# Patient Record
Sex: Male | Born: 1970
Health system: Southern US, Community
[De-identification: ages and names within clinical notes are randomized; demographics above are authoritative.]

## PROBLEM LIST (undated history)

## (undated) DIAGNOSIS — N529 Male erectile dysfunction, unspecified: Secondary | ICD-10-CM

## (undated) DIAGNOSIS — M1990 Unspecified osteoarthritis, unspecified site: Secondary | ICD-10-CM

## (undated) DIAGNOSIS — K219 Gastro-esophageal reflux disease without esophagitis: Secondary | ICD-10-CM

## (undated) HISTORY — DX: Male erectile dysfunction, unspecified: N52.9

## (undated) HISTORY — PX: OTHER SURGICAL HISTORY: SHX169

## (undated) HISTORY — DX: Unspecified osteoarthritis, unspecified site: M19.90

## (undated) HISTORY — DX: Gastro-esophageal reflux disease without esophagitis: K21.9

## (undated) HISTORY — PX: PELVIC FRACTURE SURGERY: SHX119

---

## 2014-12-24 ENCOUNTER — Ambulatory Visit: Payer: Self-pay | Admitting: Family Medicine

## 2015-01-24 ENCOUNTER — Ambulatory Visit (INDEPENDENT_AMBULATORY_CARE_PROVIDER_SITE_OTHER): Payer: Self-pay | Admitting: Family Medicine

## 2015-01-24 DIAGNOSIS — F99 Mental disorder, not otherwise specified: Secondary | ICD-10-CM

## 2015-01-24 NOTE — Progress Notes (Signed)
No show new patient visit 01/24/2015

## 2015-03-03 ENCOUNTER — Ambulatory Visit: Payer: Self-pay | Admitting: Family Medicine

## 2015-03-07 ENCOUNTER — Ambulatory Visit (INDEPENDENT_AMBULATORY_CARE_PROVIDER_SITE_OTHER): Payer: BLUE CROSS/BLUE SHIELD

## 2015-03-07 ENCOUNTER — Encounter: Payer: Self-pay | Admitting: Family Medicine

## 2015-03-07 ENCOUNTER — Telehealth: Payer: Self-pay | Admitting: Family Medicine

## 2015-03-07 ENCOUNTER — Other Ambulatory Visit: Payer: Self-pay | Admitting: Family Medicine

## 2015-03-07 ENCOUNTER — Ambulatory Visit (INDEPENDENT_AMBULATORY_CARE_PROVIDER_SITE_OTHER): Payer: BLUE CROSS/BLUE SHIELD | Admitting: Family Medicine

## 2015-03-07 VITALS — BP 119/71 | HR 56 | Ht 72.0 in | Wt 210.0 lb

## 2015-03-07 DIAGNOSIS — Z23 Encounter for immunization: Secondary | ICD-10-CM

## 2015-03-07 DIAGNOSIS — M545 Low back pain, unspecified: Secondary | ICD-10-CM

## 2015-03-07 DIAGNOSIS — Z8781 Personal history of (healed) traumatic fracture: Secondary | ICD-10-CM | POA: Insufficient documentation

## 2015-03-07 DIAGNOSIS — M5137 Other intervertebral disc degeneration, lumbosacral region: Secondary | ICD-10-CM | POA: Diagnosis not present

## 2015-03-07 DIAGNOSIS — N521 Erectile dysfunction due to diseases classified elsewhere: Secondary | ICD-10-CM

## 2015-03-07 DIAGNOSIS — M899 Disorder of bone, unspecified: Secondary | ICD-10-CM

## 2015-03-07 DIAGNOSIS — M533 Sacrococcygeal disorders, not elsewhere classified: Secondary | ICD-10-CM | POA: Diagnosis not present

## 2015-03-07 DIAGNOSIS — E663 Overweight: Secondary | ICD-10-CM | POA: Insufficient documentation

## 2015-03-07 DIAGNOSIS — N529 Male erectile dysfunction, unspecified: Secondary | ICD-10-CM | POA: Insufficient documentation

## 2015-03-07 LAB — CBC
HCT: 45.1 % (ref 39.0–52.0)
HEMOGLOBIN: 15.1 g/dL (ref 13.0–17.0)
MCH: 28.4 pg (ref 26.0–34.0)
MCHC: 33.5 g/dL (ref 30.0–36.0)
MCV: 84.9 fL (ref 78.0–100.0)
MPV: 11.2 fL (ref 8.6–12.4)
Platelets: 224 10*3/uL (ref 150–400)
RBC: 5.31 MIL/uL (ref 4.22–5.81)
RDW: 13.8 % (ref 11.5–15.5)
WBC: 5.9 10*3/uL (ref 4.0–10.5)

## 2015-03-07 LAB — LDL CHOLESTEROL, DIRECT: LDL DIRECT: 131 mg/dL — AB

## 2015-03-07 MED ORDER — OMEPRAZOLE 40 MG PO CPDR: 40.0000 mg | DELAYED_RELEASE_CAPSULE | Freq: Every day | ORAL | Status: AC

## 2015-03-07 NOTE — Assessment & Plan Note (Signed)
Possibly related to his pelvis fracture history. Obtain lumbar and pelvis films. Continued no more than 4 Goody powder per day. Start omeprazole for GI prophylaxis with high dose aspirin.

## 2015-03-07 NOTE — Progress Notes (Signed)
Blake Kelly is a 44 y.o. male who presents to [place]  today for establish care and discuss several medical issues.  1) neck pain. Patient has intermittent chronic low back pain. The pain typically does not radiate. No weakness or numbness bowel bladder dysfunction or difficulty walking. He rates the back pain to a history of an open book pelvis fracture several years ago. This was repaired surgically. He takes 2 baby powders typically in the morning and a max of 45. He powders during the day total as needed for pain. No radiating pain weakness or numbness. No new injury. He feels well otherwise.  2) erectile dysfunction: Patient notes occasional problems sustaining erections. This is happening after his pelvis fracture. He denies any numbness to his penis. He uses over-the-counter libido mass supplements which seemed to help well. He feels well and satisfied in his sexual life.  3) patient drinks soda excessively sometimes 2 L per day of sugars sweetened soda.  Patient works as a Product manager.    Past Medical History  Diagnosis Date  . Erectile dysfunction    Past Surgical History  Procedure Laterality Date  . Pelvic fracture surgery    .  pelvis fracture repair     History  Substance Use Topics  . Smoking status: Former Research scientist (life sciences)  . Smokeless tobacco: Not on file  . Alcohol Use: No   ROS as above Medications: Current Outpatient Prescriptions  Medication Sig Dispense Refill  . AMBULATORY NON FORMULARY MEDICATION tumeric    . AMBULATORY NON FORMULARY MEDICATION libidomax    . Aspirin-Salicylamide-Caffeine (BC HEADACHE POWDER PO) Take by mouth.    . Glucosamine-Chondroitin (MOVE FREE PO) Take by mouth.    Marland Kitchen Specialty Vitamins Products (MAGNESIUM, AMINO ACID CHELATE,) 133 MG tablet Take 1 tablet by mouth 2 (two) times daily.    Marland Kitchen omeprazole (PRILOSEC) 40 MG capsule Take 1 capsule (40 mg total) by mouth daily. 90 capsule 3   No current  facility-administered medications for this visit.   No Known Allergies   Exam:  BP 119/71 mmHg  Pulse 56  Ht 6' (1.829 m)  Wt 210 lb (95.255 kg)  BMI 28.47 kg/m2 Gen: Well NAD HEENT: EOMI,  MMM Lungs: Normal work of breathing. CTABL Heart: RRR no MRG Abd: NABS, Soft. Nondistended, Nontender Exts: Brisk capillary refill, warm and well perfused.   Back: Mildly tender bilateral SI joint. Normal back range of motion. Normal gait.  No results found for this or any previous visit (from the past 24 hour(s)). No results found.   Please see individual assessment and plan sections.

## 2015-03-07 NOTE — Patient Instructions (Addendum)
Thank you for coming in today. Continue the over-the-counter libido Max as desired. Take Goody powder as you have been no more than 4 per day. Take omeprazole daily to protect your stomach. Return in one year or sooner if needed Drinking soda so much. Come back or go to the emergency room if you notice new weakness new numbness problems walking or bowel or bladder problems.

## 2015-03-07 NOTE — Assessment & Plan Note (Signed)
Patient is satisfied with his sexual life with the over-the-counter medication. No change at this time. We'll start Cialis or Viagra as needed.

## 2015-03-07 NOTE — Assessment & Plan Note (Addendum)
Check CBC, CMP, direct LDL. Decrease sodium intake

## 2015-03-07 NOTE — Telephone Encounter (Signed)
Sclerotic lesion on xray. Bone scan ordered. CMA will call pt.

## 2015-03-11 ENCOUNTER — Encounter (HOSPITAL_COMMUNITY): Payer: BLUE CROSS/BLUE SHIELD

## 2015-03-20 ENCOUNTER — Encounter (HOSPITAL_COMMUNITY)
Admission: RE | Admit: 2015-03-20 | Discharge: 2015-03-20 | Disposition: A | Discharge status: Home / Self Care | Payer: BLUE CROSS/BLUE SHIELD | Source: Ambulatory Visit | Attending: Family Medicine | Admitting: Family Medicine

## 2015-03-20 DIAGNOSIS — M899 Disorder of bone, unspecified: Secondary | ICD-10-CM | POA: Diagnosis present

## 2015-03-20 MED ORDER — TECHNETIUM TC 99M MEDRONATE IV KIT
25.8000 | PACK | Freq: Once | INTRAVENOUS | Status: AC | PRN
  Administered 2015-03-20: 25.8 via INTRAVENOUS

## 2015-03-20 NOTE — Progress Notes (Signed)
Quick Note:  Normal scan. No cancer! ______

## 2016-11-08 ENCOUNTER — Ambulatory Visit (INDEPENDENT_AMBULATORY_CARE_PROVIDER_SITE_OTHER): Payer: BLUE CROSS/BLUE SHIELD | Admitting: Family Medicine

## 2016-11-08 ENCOUNTER — Ambulatory Visit (INDEPENDENT_AMBULATORY_CARE_PROVIDER_SITE_OTHER): Payer: BLUE CROSS/BLUE SHIELD

## 2016-11-08 VITALS — BP 132/81 | HR 51 | Ht 72.0 in | Wt 238.0 lb

## 2016-11-08 DIAGNOSIS — Z0000 Encounter for general adult medical examination without abnormal findings: Secondary | ICD-10-CM

## 2016-11-08 DIAGNOSIS — M79642 Pain in left hand: Secondary | ICD-10-CM

## 2016-11-08 DIAGNOSIS — R234 Changes in skin texture: Secondary | ICD-10-CM | POA: Diagnosis not present

## 2016-11-08 DIAGNOSIS — E663 Overweight: Secondary | ICD-10-CM

## 2016-11-08 DIAGNOSIS — M79641 Pain in right hand: Secondary | ICD-10-CM

## 2016-11-08 DIAGNOSIS — M899 Disorder of bone, unspecified: Secondary | ICD-10-CM

## 2016-11-08 DIAGNOSIS — M949 Disorder of cartilage, unspecified: Secondary | ICD-10-CM

## 2016-11-08 LAB — CBC
HEMATOCRIT: 44.7 % (ref 38.5–50.0)
HEMOGLOBIN: 15.3 g/dL (ref 13.2–17.1)
MCH: 29 pg (ref 27.0–33.0)
MCHC: 34.2 g/dL (ref 32.0–36.0)
MCV: 84.8 fL (ref 80.0–100.0)
MPV: 10.1 fL (ref 7.5–12.5)
Platelets: 232 10*3/uL (ref 140–400)
RBC: 5.27 MIL/uL (ref 4.20–5.80)
RDW: 13.5 % (ref 11.0–15.0)
WBC: 5.1 10*3/uL (ref 3.8–10.8)

## 2016-11-08 LAB — TSH: TSH: 1.63 m[IU]/L (ref 0.40–4.50)

## 2016-11-08 MED ORDER — FLUOCINONIDE 0.05 % EX OINT: 1.0000 "application " | TOPICAL_OINTMENT | Freq: Two times a day (BID) | CUTANEOUS | 0 refills | Status: DC

## 2016-11-08 MED ORDER — DICLOFENAC SODIUM 1 % TD GEL: 4.0000 g | Freq: Four times a day (QID) | TRANSDERMAL | 11 refills | Status: DC

## 2016-11-08 NOTE — Progress Notes (Signed)
Blake Kelly is a 46 y.o. male who presents to [place]: [place] today for physical and discuss hand pain and dry cracked skin.   Maxi is a fit and active 46 year old male. 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 He notes continued diffuse joint pain especially in his hands bilaterally. He notes a positive family history for psoriasis and worries that he may have a rheumatologic condition. He's takes EC patter 4 times daily and omeprazole daily.   Additionally he notes thickened dry skin on his hands and feet. This regularly becomes cracked. He occasionally uses moisturizers. He feels well otherwise. No fevers chills nausea vomiting or diarrhea. He does note that his hands swell up after he drinks dark sodas.   Past Medical History:  Diagnosis Date  . Erectile dysfunction    Past Surgical History:  Procedure Laterality Date  .  pelvis fracture repair    . PELVIC FRACTURE SURGERY     Social History  Substance Use Topics  . Smoking status: Former Research scientist (life sciences)  . Smokeless tobacco: Not on file  . Alcohol use No   family history is not on file.  ROS as above:  Medications: Current Outpatient Prescriptions  Medication Sig Dispense Refill  . AMBULATORY NON FORMULARY MEDICATION tumeric    . AMBULATORY NON FORMULARY MEDICATION libidomax    . Aspirin-Salicylamide-Caffeine (BC HEADACHE POWDER PO) Take by mouth.    . diclofenac sodium (VOLTAREN) 1 % GEL Apply 4 g topically 4 (four) times daily. To affected joint. 100 g 11  . fluocinonide ointment (LIDEX) 1.61 % Apply 1 application topically 2 (two) times daily. 30 g 0  . Glucosamine-Chondroitin (MOVE FREE PO) Take by mouth.    Marland Kitchen omeprazole (PRILOSEC) 40 MG capsule Take 1 capsule (40 mg total) by mouth daily. 90 capsule 3  . Specialty Vitamins Products (MAGNESIUM, AMINO ACID CHELATE,) 133 MG  tablet Take 1 tablet by mouth 2 (two) times daily.     No current facility-administered medications for this visit.    No Known Allergies  Health Maintenance Health Maintenance  Topic Date Due  . HIV Screening  03/14/1985  . INFLUENZA VACCINE  05/11/2017 (Originally 03/23/2016)  . TETANUS/TDAP  03/06/2025     Exam:  BP 132/81   Pulse (!) 51   Ht 6' (1.829 m)   Wt 238 lb (108 kg)   BMI 32.28 kg/m  Gen: Well NAD HEENT: EOMI,  MMM Lungs: Normal work of breathing. CTABL Heart: RRR no MRG Abd: NABS, Soft. Nondistended, Nontender Exts: Brisk capillary refill, warm and well perfused.  Skin: Thickened cracked skin hands and feet bilaterally.   No results found for this or any previous visit (from the past 72 hour(s)). Dg Hand Complete Left  Result Date: 11/08/2016 CLINICAL DATA:  Left hand pain.  No reported injury.  No recent. EXAM: LEFT HAND - COMPLETE 3+ VIEW COMPARISON:  None. FINDINGS: No acute bony or joint abnormality identified. No evidence of fracture or dislocation. IMPRESSION: No acute or focal abnormality identified. No evidence of fracture or dislocation. Electronically Signed   By: Marcello Moores  Register   On: 11/08/2016 09:35      Assessment and Plan: 46 y.o. male with well adult. Doing well. Check basic metabolic labs listed below.  Diffuse hand pain: We will complete a limited rheumatologic workup listed below a treating with diclofenac gel.  Thickened cracked skin: Unclear etiology. Recommended  moisturizer. Will treat with high potency steroid cream on hot spots.   Orders Placed This Encounter  Procedures  . DG Hand Complete Left    Standing Status:   Future    Number of Occurrences:   1    Standing Expiration Date:   01/08/2018    Order Specific Question:   Reason for Exam (SYMPTOM  OR DIAGNOSIS REQUIRED)    Answer:   eval left hand and wrist pain suspect arthritis    Order Specific Question:   Preferred imaging location?    Answer:   [facility]    . CBC  . COMPLETE METABOLIC PANEL WITH GFR  . Lipid Panel w/reflex Direct LDL  . TSH  . VITAMIN D 25 Hydroxy (Vit-D Deficiency, Fractures)  . Cyclic citrul peptide antibody, IgG  . Complement, total  . HLA-B27 antigen  . CK  . ANA  . Sedimentation rate   Meds ordered this encounter  Medications  . diclofenac sodium (VOLTAREN) 1 % GEL    Sig: Apply 4 g topically 4 (four) times daily. To affected joint.    Dispense:  100 g    Refill:  11  . fluocinonide ointment (LIDEX) 0.05 %    Sig: Apply 1 application topically 2 (two) times daily.    Dispense:  30 g    Refill:  0     Discussed warning signs or symptoms. Please see discharge instructions. Patient expresses understanding.

## 2016-11-08 NOTE — Patient Instructions (Signed)
Thank you for coming in today. Get labs today.  I will get results ASAP.  Get xray today.  Use voltaren gel for pain up to 4x daily.  Use the high potency steroid cream on the hot spots.  Use as much ointment (Vaseline) as you can on you hand and feet.   Lets recheck in a month or so.

## 2016-11-09 ENCOUNTER — Encounter: Payer: Self-pay | Admitting: Family Medicine

## 2016-11-09 DIAGNOSIS — R740 Nonspecific elevation of levels of transaminase and lactic acid dehydrogenase [LDH]: Secondary | ICD-10-CM

## 2016-11-09 DIAGNOSIS — R7401 Elevation of levels of liver transaminase levels: Secondary | ICD-10-CM | POA: Insufficient documentation

## 2016-11-09 DIAGNOSIS — R748 Abnormal levels of other serum enzymes: Secondary | ICD-10-CM | POA: Insufficient documentation

## 2016-11-09 LAB — COMPLETE METABOLIC PANEL WITH GFR
ALBUMIN: 4.2 g/dL (ref 3.6–5.1)
ALK PHOS: 69 U/L (ref 40–115)
ALT: 54 U/L — AB (ref 9–46)
AST: 43 U/L — AB (ref 10–40)
BUN: 19 mg/dL (ref 7–25)
CALCIUM: 9.3 mg/dL (ref 8.6–10.3)
CO2: 19 mmol/L — ABNORMAL LOW (ref 20–31)
CREATININE: 1.09 mg/dL (ref 0.60–1.35)
Chloride: 109 mmol/L (ref 98–110)
GFR, Est African American: >89 mL/min (ref >=60)
GFR, Est Non African American: 81 mL/min (ref >=60)
GLUCOSE: 103 mg/dL — AB (ref 65–99)
Potassium: 4.8 mmol/L (ref 3.5–5.3)
SODIUM: 143 mmol/L (ref 135–146)
Total Bilirubin: 0.2 mg/dL (ref 0.2–1.2)
Total Protein: 6.4 g/dL (ref 6.1–8.1)

## 2016-11-09 LAB — CK: Total CK: 370 U/L — ABNORMAL HIGH (ref 7–232)

## 2016-11-09 LAB — LIPID PANEL W/REFLEX DIRECT LDL
CHOLESTEROL: 211 mg/dL — AB (ref <200)
HDL: 50 mg/dL (ref >40)
LDL-Cholesterol: 146 mg/dL — ABNORMAL HIGH
NON-HDL CHOLESTEROL (CALC): 161 mg/dL — AB (ref <130)
Total CHOL/HDL Ratio: 4.2 Ratio (ref <5.0)
Triglycerides: 55 mg/dL (ref <150)

## 2016-11-09 LAB — ANA: Anti Nuclear Antibody(ANA): POSITIVE — AB

## 2016-11-09 LAB — CYCLIC CITRUL PEPTIDE ANTIBODY, IGG

## 2016-11-09 LAB — SEDIMENTATION RATE: Sed Rate: 4 mm/hr (ref 0–15)

## 2016-11-09 LAB — ANTI-NUCLEAR AB-TITER (ANA TITER): ANA Titer 1: 1:40 {titer} — ABNORMAL HIGH

## 2016-11-09 LAB — VITAMIN D 25 HYDROXY (VIT D DEFICIENCY, FRACTURES): VIT D 25 HYDROXY: 49 ng/mL (ref 30–100)

## 2016-11-10 LAB — COMPLEMENT, TOTAL: Compl, Total (CH50): 60 U/mL (ref 31–60)

## 2016-11-12 ENCOUNTER — Telehealth: Payer: Self-pay | Admitting: Family Medicine

## 2016-11-12 NOTE — Telephone Encounter (Signed)
Received PA on Diclofenac Sodium sent through cover my meds waiting on Authorization. - CF

## 2016-11-13 LAB — HLA-B27 ANTIGEN: DNA RESULT: NEGATIVE

## 2016-12-02 NOTE — Telephone Encounter (Signed)
I checked prior authorization on cover my meds and they denied medication. - CF

## 2016-12-06 ENCOUNTER — Ambulatory Visit (INDEPENDENT_AMBULATORY_CARE_PROVIDER_SITE_OTHER): Payer: BLUE CROSS/BLUE SHIELD | Admitting: Family Medicine

## 2016-12-06 ENCOUNTER — Encounter: Payer: Self-pay | Admitting: Family Medicine

## 2016-12-06 VITALS — BP 134/77 | HR 59 | Wt 240.0 lb

## 2016-12-06 DIAGNOSIS — R748 Abnormal levels of other serum enzymes: Secondary | ICD-10-CM

## 2016-12-06 DIAGNOSIS — R234 Changes in skin texture: Secondary | ICD-10-CM | POA: Diagnosis not present

## 2016-12-06 DIAGNOSIS — M79641 Pain in right hand: Secondary | ICD-10-CM | POA: Diagnosis not present

## 2016-12-06 DIAGNOSIS — M79642 Pain in left hand: Secondary | ICD-10-CM

## 2016-12-06 DIAGNOSIS — R740 Nonspecific elevation of levels of transaminase and lactic acid dehydrogenase [LDH]: Secondary | ICD-10-CM

## 2016-12-06 DIAGNOSIS — R7401 Elevation of levels of liver transaminase levels: Secondary | ICD-10-CM

## 2016-12-06 LAB — COMPREHENSIVE METABOLIC PANEL
ALT: 38 U/L (ref 9–46)
AST: 26 U/L (ref 10–40)
Albumin: 4 g/dL (ref 3.6–5.1)
Alkaline Phosphatase: 66 U/L (ref 40–115)
BUN: 16 mg/dL (ref 7–25)
CHLORIDE: 107 mmol/L (ref 98–110)
CO2: 27 mmol/L (ref 20–31)
CREATININE: 0.91 mg/dL (ref 0.60–1.35)
Calcium: 9.3 mg/dL (ref 8.6–10.3)
GLUCOSE: 98 mg/dL (ref 65–99)
Potassium: 4.3 mmol/L (ref 3.5–5.3)
SODIUM: 142 mmol/L (ref 135–146)
TOTAL PROTEIN: 6.4 g/dL (ref 6.1–8.1)
Total Bilirubin: 0.4 mg/dL (ref 0.2–1.2)

## 2016-12-06 NOTE — Progress Notes (Signed)
Blake Kelly is a 46 y.o. male who presents to [place]: [place] today for follow-up on rash and hand pain. Patient was seen previously worries found to have dry cracked skin as well as diffuse body pain. As part of a workup he had a rheumatologic panel obtained which showed a mildly positive ANA with a 1:40 titer speckled pattern. Additionally had mildly elevated CK. Sedimentation rate was normal. He notes bilateral wrist and hip pain. He notes that he's been using steroid cream for his hands which helps him along with moisturizers.   Past Medical History:  Diagnosis Date  . Erectile dysfunction    Past Surgical History:  Procedure Laterality Date  .  pelvis fracture repair    . PELVIC FRACTURE SURGERY     Social History  Substance Use Topics  . Smoking status: Former Research scientist (life sciences)  . Smokeless tobacco: Never Used  . Alcohol use No   family history is not on file.  ROS as above:  Medications: Current Outpatient Prescriptions  Medication Sig Dispense Refill  . AMBULATORY NON FORMULARY MEDICATION tumeric    . AMBULATORY NON FORMULARY MEDICATION libidomax    . Aspirin-Salicylamide-Caffeine (BC HEADACHE POWDER PO) Take by mouth.    . diclofenac sodium (VOLTAREN) 1 % GEL Apply 4 g topically 4 (four) times daily. To affected joint. 100 g 11  . fluocinonide ointment (LIDEX) 3.84 % Apply 1 application topically 2 (two) times daily. 30 g 0  . Glucosamine-Chondroitin (MOVE FREE PO) Take by mouth.    Marland Kitchen omeprazole (PRILOSEC) 40 MG capsule Take 1 capsule (40 mg total) by mouth daily. 90 capsule 3  . Specialty Vitamins Products (MAGNESIUM, AMINO ACID CHELATE,) 133 MG tablet Take 1 tablet by mouth 2 (two) times daily.     No current facility-administered medications for this visit.    No Known Allergies  Health Maintenance Health Maintenance  Topic Date Due  . HIV Screening   03/14/1985  . INFLUENZA VACCINE  05/11/2017 (Originally 03/23/2017)  . TETANUS/TDAP  03/06/2025     Exam:  BP 134/77   Pulse (!) 59   Wt 240 lb (108.9 kg)   BMI 32.55 kg/m  Gen: Well NAD HEENT: EOMI,  MMM Lungs: Normal work of breathing. CTABL Heart: RRR no MRG Abd: NABS, Soft. Nondistended, Nontender Exts: Brisk capillary refill, warm and well perfused. Synovitis present hands bilaterally. Skin: Dry cracked skin palmar hands bilaterally.  Lab Results  Component Value Date   ANA POS (A) 11/08/2016   Lab Results  Component Value Date   CKTOTAL 370 (H) 11/08/2016      Chemistry      Component Value Date/Time   NA 143 11/08/2016 0934   K 4.8 11/08/2016 0934   CL 109 11/08/2016 0934   CO2 19 (L) 11/08/2016 0934   BUN 19 11/08/2016 0934   CREATININE 1.09 11/08/2016 0934      Component Value Date/Time   CALCIUM 9.3 11/08/2016 0934   ALKPHOS 69 11/08/2016 0934   AST 43 (H) 11/08/2016 0934   ALT 54 (H) 11/08/2016 0934   BILITOT 0.2 11/08/2016 0934       No results found for this or any previous visit (from the past 72 hour(s)). No results found.    Assessment and Plan: 46 y.o. male with  Pain with elevated ANA and CK. Plan to recheck labs along with more specific rheumatologic labs including lupus workup listed below..  As for cracked skin  recommend continued moisturizers.  We'll recheck CMP based on mildly elevated transaminitis last time.  Orders Placed This Encounter  Procedures  . CK  . Comprehensive metabolic panel  . Anti-DNA antibody, double-stranded  . Anti-Smith antibody  . Sjogrens syndrome-A extractable nuclear antibody   No orders of the defined types were placed in this encounter.    Discussed warning signs or symptoms. Please see discharge instructions. Patient expresses understanding.

## 2016-12-06 NOTE — Patient Instructions (Signed)
Thank you for coming in today. Get labs today.  I will contact you with results.  Keep those hand moisturized.

## 2016-12-07 LAB — ANTI-SMITH ANTIBODY: ENA SM Ab Ser-aCnc: <1

## 2016-12-07 LAB — CK: CK TOTAL: 259 U/L — AB (ref 7–232)

## 2016-12-07 LAB — SJOGRENS SYNDROME-A EXTRACTABLE NUCLEAR ANTIBODY: SSA (Ro) (ENA) Antibody, IgG: <1

## 2016-12-07 LAB — ANTI-DNA ANTIBODY, DOUBLE-STRANDED: ds DNA Ab: <1 IU/mL

## 2017-05-19 DIAGNOSIS — M9905 Segmental and somatic dysfunction of pelvic region: Secondary | ICD-10-CM | POA: Diagnosis not present

## 2017-05-19 DIAGNOSIS — M5137 Other intervertebral disc degeneration, lumbosacral region: Secondary | ICD-10-CM | POA: Diagnosis not present

## 2017-05-19 DIAGNOSIS — M9904 Segmental and somatic dysfunction of sacral region: Secondary | ICD-10-CM | POA: Diagnosis not present

## 2017-05-19 DIAGNOSIS — M9903 Segmental and somatic dysfunction of lumbar region: Secondary | ICD-10-CM | POA: Diagnosis not present

## 2017-06-30 DIAGNOSIS — M9903 Segmental and somatic dysfunction of lumbar region: Secondary | ICD-10-CM | POA: Diagnosis not present

## 2017-06-30 DIAGNOSIS — M9905 Segmental and somatic dysfunction of pelvic region: Secondary | ICD-10-CM | POA: Diagnosis not present

## 2017-06-30 DIAGNOSIS — M5137 Other intervertebral disc degeneration, lumbosacral region: Secondary | ICD-10-CM | POA: Diagnosis not present

## 2017-06-30 DIAGNOSIS — M9904 Segmental and somatic dysfunction of sacral region: Secondary | ICD-10-CM | POA: Diagnosis not present

## 2017-07-28 DIAGNOSIS — M9903 Segmental and somatic dysfunction of lumbar region: Secondary | ICD-10-CM | POA: Diagnosis not present

## 2017-07-28 DIAGNOSIS — M9904 Segmental and somatic dysfunction of sacral region: Secondary | ICD-10-CM | POA: Diagnosis not present

## 2017-07-28 DIAGNOSIS — M5137 Other intervertebral disc degeneration, lumbosacral region: Secondary | ICD-10-CM | POA: Diagnosis not present

## 2017-07-28 DIAGNOSIS — M9905 Segmental and somatic dysfunction of pelvic region: Secondary | ICD-10-CM | POA: Diagnosis not present

## 2017-08-11 DIAGNOSIS — M9904 Segmental and somatic dysfunction of sacral region: Secondary | ICD-10-CM | POA: Diagnosis not present

## 2017-08-11 DIAGNOSIS — M5137 Other intervertebral disc degeneration, lumbosacral region: Secondary | ICD-10-CM | POA: Diagnosis not present

## 2017-08-11 DIAGNOSIS — M9903 Segmental and somatic dysfunction of lumbar region: Secondary | ICD-10-CM | POA: Diagnosis not present

## 2017-08-11 DIAGNOSIS — M9905 Segmental and somatic dysfunction of pelvic region: Secondary | ICD-10-CM | POA: Diagnosis not present

## 2017-08-29 DIAGNOSIS — M9905 Segmental and somatic dysfunction of pelvic region: Secondary | ICD-10-CM | POA: Diagnosis not present

## 2017-08-29 DIAGNOSIS — M5137 Other intervertebral disc degeneration, lumbosacral region: Secondary | ICD-10-CM | POA: Diagnosis not present

## 2017-08-29 DIAGNOSIS — M9903 Segmental and somatic dysfunction of lumbar region: Secondary | ICD-10-CM | POA: Diagnosis not present

## 2017-08-29 DIAGNOSIS — M9904 Segmental and somatic dysfunction of sacral region: Secondary | ICD-10-CM | POA: Diagnosis not present

## 2017-09-29 DIAGNOSIS — M9905 Segmental and somatic dysfunction of pelvic region: Secondary | ICD-10-CM | POA: Diagnosis not present

## 2017-09-29 DIAGNOSIS — M5137 Other intervertebral disc degeneration, lumbosacral region: Secondary | ICD-10-CM | POA: Diagnosis not present

## 2017-09-29 DIAGNOSIS — M9904 Segmental and somatic dysfunction of sacral region: Secondary | ICD-10-CM | POA: Diagnosis not present

## 2017-09-29 DIAGNOSIS — M9903 Segmental and somatic dysfunction of lumbar region: Secondary | ICD-10-CM | POA: Diagnosis not present

## 2017-10-06 DIAGNOSIS — M9903 Segmental and somatic dysfunction of lumbar region: Secondary | ICD-10-CM | POA: Diagnosis not present

## 2017-10-06 DIAGNOSIS — M9904 Segmental and somatic dysfunction of sacral region: Secondary | ICD-10-CM | POA: Diagnosis not present

## 2017-10-06 DIAGNOSIS — M9905 Segmental and somatic dysfunction of pelvic region: Secondary | ICD-10-CM | POA: Diagnosis not present

## 2017-10-06 DIAGNOSIS — M5137 Other intervertebral disc degeneration, lumbosacral region: Secondary | ICD-10-CM | POA: Diagnosis not present

## 2017-11-03 DIAGNOSIS — M9903 Segmental and somatic dysfunction of lumbar region: Secondary | ICD-10-CM | POA: Diagnosis not present

## 2017-11-03 DIAGNOSIS — M9904 Segmental and somatic dysfunction of sacral region: Secondary | ICD-10-CM | POA: Diagnosis not present

## 2017-11-03 DIAGNOSIS — M5137 Other intervertebral disc degeneration, lumbosacral region: Secondary | ICD-10-CM | POA: Diagnosis not present

## 2017-11-03 DIAGNOSIS — M9905 Segmental and somatic dysfunction of pelvic region: Secondary | ICD-10-CM | POA: Diagnosis not present

## 2017-11-28 DIAGNOSIS — M9904 Segmental and somatic dysfunction of sacral region: Secondary | ICD-10-CM | POA: Diagnosis not present

## 2017-11-28 DIAGNOSIS — M5137 Other intervertebral disc degeneration, lumbosacral region: Secondary | ICD-10-CM | POA: Diagnosis not present

## 2017-11-28 DIAGNOSIS — M9905 Segmental and somatic dysfunction of pelvic region: Secondary | ICD-10-CM | POA: Diagnosis not present

## 2017-11-28 DIAGNOSIS — M9903 Segmental and somatic dysfunction of lumbar region: Secondary | ICD-10-CM | POA: Diagnosis not present

## 2017-12-07 DIAGNOSIS — M9903 Segmental and somatic dysfunction of lumbar region: Secondary | ICD-10-CM | POA: Diagnosis not present

## 2017-12-07 DIAGNOSIS — M9904 Segmental and somatic dysfunction of sacral region: Secondary | ICD-10-CM | POA: Diagnosis not present

## 2017-12-07 DIAGNOSIS — M5137 Other intervertebral disc degeneration, lumbosacral region: Secondary | ICD-10-CM | POA: Diagnosis not present

## 2017-12-07 DIAGNOSIS — M9905 Segmental and somatic dysfunction of pelvic region: Secondary | ICD-10-CM | POA: Diagnosis not present

## 2017-12-20 DIAGNOSIS — M9903 Segmental and somatic dysfunction of lumbar region: Secondary | ICD-10-CM | POA: Diagnosis not present

## 2017-12-20 DIAGNOSIS — M9904 Segmental and somatic dysfunction of sacral region: Secondary | ICD-10-CM | POA: Diagnosis not present

## 2017-12-20 DIAGNOSIS — M9905 Segmental and somatic dysfunction of pelvic region: Secondary | ICD-10-CM | POA: Diagnosis not present

## 2017-12-20 DIAGNOSIS — M5137 Other intervertebral disc degeneration, lumbosacral region: Secondary | ICD-10-CM | POA: Diagnosis not present

## 2018-02-20 DIAGNOSIS — M5137 Other intervertebral disc degeneration, lumbosacral region: Secondary | ICD-10-CM | POA: Diagnosis not present

## 2018-02-20 DIAGNOSIS — M9904 Segmental and somatic dysfunction of sacral region: Secondary | ICD-10-CM | POA: Diagnosis not present

## 2018-02-20 DIAGNOSIS — M9905 Segmental and somatic dysfunction of pelvic region: Secondary | ICD-10-CM | POA: Diagnosis not present

## 2018-02-20 DIAGNOSIS — M9903 Segmental and somatic dysfunction of lumbar region: Secondary | ICD-10-CM | POA: Diagnosis not present

## 2018-03-06 DIAGNOSIS — M9904 Segmental and somatic dysfunction of sacral region: Secondary | ICD-10-CM | POA: Diagnosis not present

## 2018-03-06 DIAGNOSIS — M5137 Other intervertebral disc degeneration, lumbosacral region: Secondary | ICD-10-CM | POA: Diagnosis not present

## 2018-03-06 DIAGNOSIS — M9905 Segmental and somatic dysfunction of pelvic region: Secondary | ICD-10-CM | POA: Diagnosis not present

## 2018-03-06 DIAGNOSIS — M9903 Segmental and somatic dysfunction of lumbar region: Secondary | ICD-10-CM | POA: Diagnosis not present

## 2018-03-09 DIAGNOSIS — M9903 Segmental and somatic dysfunction of lumbar region: Secondary | ICD-10-CM | POA: Diagnosis not present

## 2018-03-09 DIAGNOSIS — M5137 Other intervertebral disc degeneration, lumbosacral region: Secondary | ICD-10-CM | POA: Diagnosis not present

## 2018-03-09 DIAGNOSIS — M9904 Segmental and somatic dysfunction of sacral region: Secondary | ICD-10-CM | POA: Diagnosis not present

## 2018-03-09 DIAGNOSIS — M9905 Segmental and somatic dysfunction of pelvic region: Secondary | ICD-10-CM | POA: Diagnosis not present

## 2018-03-29 DIAGNOSIS — M9903 Segmental and somatic dysfunction of lumbar region: Secondary | ICD-10-CM | POA: Diagnosis not present

## 2018-03-29 DIAGNOSIS — M9904 Segmental and somatic dysfunction of sacral region: Secondary | ICD-10-CM | POA: Diagnosis not present

## 2018-03-29 DIAGNOSIS — M9905 Segmental and somatic dysfunction of pelvic region: Secondary | ICD-10-CM | POA: Diagnosis not present

## 2018-03-29 DIAGNOSIS — M5137 Other intervertebral disc degeneration, lumbosacral region: Secondary | ICD-10-CM | POA: Diagnosis not present

## 2018-04-20 DIAGNOSIS — M5137 Other intervertebral disc degeneration, lumbosacral region: Secondary | ICD-10-CM | POA: Diagnosis not present

## 2018-04-20 DIAGNOSIS — M9903 Segmental and somatic dysfunction of lumbar region: Secondary | ICD-10-CM | POA: Diagnosis not present

## 2018-04-20 DIAGNOSIS — M9904 Segmental and somatic dysfunction of sacral region: Secondary | ICD-10-CM | POA: Diagnosis not present

## 2018-04-20 DIAGNOSIS — M9905 Segmental and somatic dysfunction of pelvic region: Secondary | ICD-10-CM | POA: Diagnosis not present

## 2018-05-03 DIAGNOSIS — M5137 Other intervertebral disc degeneration, lumbosacral region: Secondary | ICD-10-CM | POA: Diagnosis not present

## 2018-05-03 DIAGNOSIS — M9904 Segmental and somatic dysfunction of sacral region: Secondary | ICD-10-CM | POA: Diagnosis not present

## 2018-05-03 DIAGNOSIS — M9905 Segmental and somatic dysfunction of pelvic region: Secondary | ICD-10-CM | POA: Diagnosis not present

## 2018-05-03 DIAGNOSIS — M9903 Segmental and somatic dysfunction of lumbar region: Secondary | ICD-10-CM | POA: Diagnosis not present

## 2018-05-15 DIAGNOSIS — M9905 Segmental and somatic dysfunction of pelvic region: Secondary | ICD-10-CM | POA: Diagnosis not present

## 2018-05-15 DIAGNOSIS — M9903 Segmental and somatic dysfunction of lumbar region: Secondary | ICD-10-CM | POA: Diagnosis not present

## 2018-05-15 DIAGNOSIS — M9904 Segmental and somatic dysfunction of sacral region: Secondary | ICD-10-CM | POA: Diagnosis not present

## 2018-05-15 DIAGNOSIS — M5137 Other intervertebral disc degeneration, lumbosacral region: Secondary | ICD-10-CM | POA: Diagnosis not present

## 2018-05-29 DIAGNOSIS — M9905 Segmental and somatic dysfunction of pelvic region: Secondary | ICD-10-CM | POA: Diagnosis not present

## 2018-05-29 DIAGNOSIS — M9903 Segmental and somatic dysfunction of lumbar region: Secondary | ICD-10-CM | POA: Diagnosis not present

## 2018-05-29 DIAGNOSIS — M5137 Other intervertebral disc degeneration, lumbosacral region: Secondary | ICD-10-CM | POA: Diagnosis not present

## 2018-05-29 DIAGNOSIS — M9904 Segmental and somatic dysfunction of sacral region: Secondary | ICD-10-CM | POA: Diagnosis not present

## 2018-07-19 DIAGNOSIS — M9903 Segmental and somatic dysfunction of lumbar region: Secondary | ICD-10-CM | POA: Diagnosis not present

## 2018-07-19 DIAGNOSIS — M5137 Other intervertebral disc degeneration, lumbosacral region: Secondary | ICD-10-CM | POA: Diagnosis not present

## 2018-07-19 DIAGNOSIS — M9904 Segmental and somatic dysfunction of sacral region: Secondary | ICD-10-CM | POA: Diagnosis not present

## 2018-07-19 DIAGNOSIS — M9905 Segmental and somatic dysfunction of pelvic region: Secondary | ICD-10-CM | POA: Diagnosis not present

## 2018-07-24 DIAGNOSIS — M9905 Segmental and somatic dysfunction of pelvic region: Secondary | ICD-10-CM | POA: Diagnosis not present

## 2018-07-24 DIAGNOSIS — M5137 Other intervertebral disc degeneration, lumbosacral region: Secondary | ICD-10-CM | POA: Diagnosis not present

## 2018-07-24 DIAGNOSIS — M9903 Segmental and somatic dysfunction of lumbar region: Secondary | ICD-10-CM | POA: Diagnosis not present

## 2018-07-24 DIAGNOSIS — M9904 Segmental and somatic dysfunction of sacral region: Secondary | ICD-10-CM | POA: Diagnosis not present

## 2018-09-07 DIAGNOSIS — M9905 Segmental and somatic dysfunction of pelvic region: Secondary | ICD-10-CM | POA: Diagnosis not present

## 2018-09-07 DIAGNOSIS — M9904 Segmental and somatic dysfunction of sacral region: Secondary | ICD-10-CM | POA: Diagnosis not present

## 2018-09-07 DIAGNOSIS — M5137 Other intervertebral disc degeneration, lumbosacral region: Secondary | ICD-10-CM | POA: Diagnosis not present

## 2018-09-07 DIAGNOSIS — M9903 Segmental and somatic dysfunction of lumbar region: Secondary | ICD-10-CM | POA: Diagnosis not present

## 2018-09-11 DIAGNOSIS — M9903 Segmental and somatic dysfunction of lumbar region: Secondary | ICD-10-CM | POA: Diagnosis not present

## 2018-09-11 DIAGNOSIS — M9904 Segmental and somatic dysfunction of sacral region: Secondary | ICD-10-CM | POA: Diagnosis not present

## 2018-09-11 DIAGNOSIS — M9905 Segmental and somatic dysfunction of pelvic region: Secondary | ICD-10-CM | POA: Diagnosis not present

## 2018-09-11 DIAGNOSIS — M5137 Other intervertebral disc degeneration, lumbosacral region: Secondary | ICD-10-CM | POA: Diagnosis not present

## 2018-10-30 DIAGNOSIS — M9905 Segmental and somatic dysfunction of pelvic region: Secondary | ICD-10-CM | POA: Diagnosis not present

## 2018-10-30 DIAGNOSIS — M9903 Segmental and somatic dysfunction of lumbar region: Secondary | ICD-10-CM | POA: Diagnosis not present

## 2018-10-30 DIAGNOSIS — M5137 Other intervertebral disc degeneration, lumbosacral region: Secondary | ICD-10-CM | POA: Diagnosis not present

## 2018-10-30 DIAGNOSIS — M9904 Segmental and somatic dysfunction of sacral region: Secondary | ICD-10-CM | POA: Diagnosis not present

## 2019-01-10 DIAGNOSIS — M9905 Segmental and somatic dysfunction of pelvic region: Secondary | ICD-10-CM | POA: Diagnosis not present

## 2019-01-10 DIAGNOSIS — M9903 Segmental and somatic dysfunction of lumbar region: Secondary | ICD-10-CM | POA: Diagnosis not present

## 2019-01-10 DIAGNOSIS — M9904 Segmental and somatic dysfunction of sacral region: Secondary | ICD-10-CM | POA: Diagnosis not present

## 2019-01-10 DIAGNOSIS — M5137 Other intervertebral disc degeneration, lumbosacral region: Secondary | ICD-10-CM | POA: Diagnosis not present

## 2019-02-12 DIAGNOSIS — M5137 Other intervertebral disc degeneration, lumbosacral region: Secondary | ICD-10-CM | POA: Diagnosis not present

## 2019-02-12 DIAGNOSIS — M9903 Segmental and somatic dysfunction of lumbar region: Secondary | ICD-10-CM | POA: Diagnosis not present

## 2019-02-12 DIAGNOSIS — M9904 Segmental and somatic dysfunction of sacral region: Secondary | ICD-10-CM | POA: Diagnosis not present

## 2019-02-12 DIAGNOSIS — M9905 Segmental and somatic dysfunction of pelvic region: Secondary | ICD-10-CM | POA: Diagnosis not present

## 2019-04-02 DIAGNOSIS — M9905 Segmental and somatic dysfunction of pelvic region: Secondary | ICD-10-CM | POA: Diagnosis not present

## 2019-04-02 DIAGNOSIS — M9904 Segmental and somatic dysfunction of sacral region: Secondary | ICD-10-CM | POA: Diagnosis not present

## 2019-04-02 DIAGNOSIS — M5137 Other intervertebral disc degeneration, lumbosacral region: Secondary | ICD-10-CM | POA: Diagnosis not present

## 2019-04-02 DIAGNOSIS — M9903 Segmental and somatic dysfunction of lumbar region: Secondary | ICD-10-CM | POA: Diagnosis not present

## 2019-04-23 DIAGNOSIS — M9904 Segmental and somatic dysfunction of sacral region: Secondary | ICD-10-CM | POA: Diagnosis not present

## 2019-04-23 DIAGNOSIS — M5137 Other intervertebral disc degeneration, lumbosacral region: Secondary | ICD-10-CM | POA: Diagnosis not present

## 2019-04-23 DIAGNOSIS — M9903 Segmental and somatic dysfunction of lumbar region: Secondary | ICD-10-CM | POA: Diagnosis not present

## 2019-04-23 DIAGNOSIS — M9905 Segmental and somatic dysfunction of pelvic region: Secondary | ICD-10-CM | POA: Diagnosis not present

## 2019-05-10 DIAGNOSIS — M9904 Segmental and somatic dysfunction of sacral region: Secondary | ICD-10-CM | POA: Diagnosis not present

## 2019-05-10 DIAGNOSIS — M5137 Other intervertebral disc degeneration, lumbosacral region: Secondary | ICD-10-CM | POA: Diagnosis not present

## 2019-05-10 DIAGNOSIS — M9903 Segmental and somatic dysfunction of lumbar region: Secondary | ICD-10-CM | POA: Diagnosis not present

## 2019-05-10 DIAGNOSIS — M9905 Segmental and somatic dysfunction of pelvic region: Secondary | ICD-10-CM | POA: Diagnosis not present

## 2019-06-12 DIAGNOSIS — M5137 Other intervertebral disc degeneration, lumbosacral region: Secondary | ICD-10-CM | POA: Diagnosis not present

## 2019-06-12 DIAGNOSIS — M9905 Segmental and somatic dysfunction of pelvic region: Secondary | ICD-10-CM | POA: Diagnosis not present

## 2019-06-12 DIAGNOSIS — M9904 Segmental and somatic dysfunction of sacral region: Secondary | ICD-10-CM | POA: Diagnosis not present

## 2019-06-12 DIAGNOSIS — M9903 Segmental and somatic dysfunction of lumbar region: Secondary | ICD-10-CM | POA: Diagnosis not present

## 2019-07-09 DIAGNOSIS — M5137 Other intervertebral disc degeneration, lumbosacral region: Secondary | ICD-10-CM | POA: Diagnosis not present

## 2019-07-09 DIAGNOSIS — M9904 Segmental and somatic dysfunction of sacral region: Secondary | ICD-10-CM | POA: Diagnosis not present

## 2019-07-09 DIAGNOSIS — M9905 Segmental and somatic dysfunction of pelvic region: Secondary | ICD-10-CM | POA: Diagnosis not present

## 2019-07-09 DIAGNOSIS — M9903 Segmental and somatic dysfunction of lumbar region: Secondary | ICD-10-CM | POA: Diagnosis not present

## 2019-08-08 DIAGNOSIS — M5137 Other intervertebral disc degeneration, lumbosacral region: Secondary | ICD-10-CM | POA: Diagnosis not present

## 2019-08-08 DIAGNOSIS — M9905 Segmental and somatic dysfunction of pelvic region: Secondary | ICD-10-CM | POA: Diagnosis not present

## 2019-08-08 DIAGNOSIS — M9904 Segmental and somatic dysfunction of sacral region: Secondary | ICD-10-CM | POA: Diagnosis not present

## 2019-08-08 DIAGNOSIS — M9903 Segmental and somatic dysfunction of lumbar region: Secondary | ICD-10-CM | POA: Diagnosis not present

## 2019-12-17 DIAGNOSIS — M5137 Other intervertebral disc degeneration, lumbosacral region: Secondary | ICD-10-CM | POA: Diagnosis not present

## 2019-12-17 DIAGNOSIS — M9905 Segmental and somatic dysfunction of pelvic region: Secondary | ICD-10-CM | POA: Diagnosis not present

## 2019-12-17 DIAGNOSIS — M9904 Segmental and somatic dysfunction of sacral region: Secondary | ICD-10-CM | POA: Diagnosis not present

## 2019-12-17 DIAGNOSIS — M9903 Segmental and somatic dysfunction of lumbar region: Secondary | ICD-10-CM | POA: Diagnosis not present

## 2019-12-18 DIAGNOSIS — M9904 Segmental and somatic dysfunction of sacral region: Secondary | ICD-10-CM | POA: Diagnosis not present

## 2019-12-18 DIAGNOSIS — M9903 Segmental and somatic dysfunction of lumbar region: Secondary | ICD-10-CM | POA: Diagnosis not present

## 2019-12-18 DIAGNOSIS — M9905 Segmental and somatic dysfunction of pelvic region: Secondary | ICD-10-CM | POA: Diagnosis not present

## 2019-12-18 DIAGNOSIS — M5137 Other intervertebral disc degeneration, lumbosacral region: Secondary | ICD-10-CM | POA: Diagnosis not present

## 2020-01-14 DIAGNOSIS — M9903 Segmental and somatic dysfunction of lumbar region: Secondary | ICD-10-CM | POA: Diagnosis not present

## 2020-01-14 DIAGNOSIS — M9905 Segmental and somatic dysfunction of pelvic region: Secondary | ICD-10-CM | POA: Diagnosis not present

## 2020-01-14 DIAGNOSIS — M9904 Segmental and somatic dysfunction of sacral region: Secondary | ICD-10-CM | POA: Diagnosis not present

## 2020-01-14 DIAGNOSIS — M5137 Other intervertebral disc degeneration, lumbosacral region: Secondary | ICD-10-CM | POA: Diagnosis not present

## 2020-03-05 DIAGNOSIS — M9903 Segmental and somatic dysfunction of lumbar region: Secondary | ICD-10-CM | POA: Diagnosis not present

## 2020-03-05 DIAGNOSIS — M5137 Other intervertebral disc degeneration, lumbosacral region: Secondary | ICD-10-CM | POA: Diagnosis not present

## 2020-03-05 DIAGNOSIS — M9905 Segmental and somatic dysfunction of pelvic region: Secondary | ICD-10-CM | POA: Diagnosis not present

## 2020-03-05 DIAGNOSIS — M9904 Segmental and somatic dysfunction of sacral region: Secondary | ICD-10-CM | POA: Diagnosis not present

## 2020-05-07 DIAGNOSIS — M9904 Segmental and somatic dysfunction of sacral region: Secondary | ICD-10-CM | POA: Diagnosis not present

## 2020-05-07 DIAGNOSIS — M9903 Segmental and somatic dysfunction of lumbar region: Secondary | ICD-10-CM | POA: Diagnosis not present

## 2020-05-07 DIAGNOSIS — M9905 Segmental and somatic dysfunction of pelvic region: Secondary | ICD-10-CM | POA: Diagnosis not present

## 2020-05-07 DIAGNOSIS — M5137 Other intervertebral disc degeneration, lumbosacral region: Secondary | ICD-10-CM | POA: Diagnosis not present

## 2020-05-14 DIAGNOSIS — M5137 Other intervertebral disc degeneration, lumbosacral region: Secondary | ICD-10-CM | POA: Diagnosis not present

## 2020-05-14 DIAGNOSIS — M9905 Segmental and somatic dysfunction of pelvic region: Secondary | ICD-10-CM | POA: Diagnosis not present

## 2020-05-14 DIAGNOSIS — M9904 Segmental and somatic dysfunction of sacral region: Secondary | ICD-10-CM | POA: Diagnosis not present

## 2020-05-14 DIAGNOSIS — M9903 Segmental and somatic dysfunction of lumbar region: Secondary | ICD-10-CM | POA: Diagnosis not present

## 2020-05-28 DIAGNOSIS — M9903 Segmental and somatic dysfunction of lumbar region: Secondary | ICD-10-CM | POA: Diagnosis not present

## 2020-05-28 DIAGNOSIS — M9904 Segmental and somatic dysfunction of sacral region: Secondary | ICD-10-CM | POA: Diagnosis not present

## 2020-05-28 DIAGNOSIS — M9905 Segmental and somatic dysfunction of pelvic region: Secondary | ICD-10-CM | POA: Diagnosis not present

## 2020-05-28 DIAGNOSIS — M5137 Other intervertebral disc degeneration, lumbosacral region: Secondary | ICD-10-CM | POA: Diagnosis not present

## 2020-06-02 DIAGNOSIS — M9905 Segmental and somatic dysfunction of pelvic region: Secondary | ICD-10-CM | POA: Diagnosis not present

## 2020-06-02 DIAGNOSIS — M5137 Other intervertebral disc degeneration, lumbosacral region: Secondary | ICD-10-CM | POA: Diagnosis not present

## 2020-06-02 DIAGNOSIS — M9904 Segmental and somatic dysfunction of sacral region: Secondary | ICD-10-CM | POA: Diagnosis not present

## 2020-06-02 DIAGNOSIS — M9903 Segmental and somatic dysfunction of lumbar region: Secondary | ICD-10-CM | POA: Diagnosis not present

## 2020-07-26 ENCOUNTER — Other Ambulatory Visit: Payer: Self-pay

## 2020-07-26 ENCOUNTER — Emergency Department (HOSPITAL_COMMUNITY): Payer: BC Managed Care – PPO

## 2020-07-26 ENCOUNTER — Encounter (HOSPITAL_COMMUNITY): Payer: Self-pay | Admitting: Emergency Medicine

## 2020-07-26 ENCOUNTER — Emergency Department (HOSPITAL_COMMUNITY)
Admission: EM | Admit: 2020-07-26 | Discharge: 2020-07-26 | Disposition: A | Discharge status: Home / Self Care | Payer: BC Managed Care – PPO | Attending: Emergency Medicine | Admitting: Emergency Medicine

## 2020-07-26 DIAGNOSIS — J181 Lobar pneumonia, unspecified organism: Secondary | ICD-10-CM | POA: Diagnosis not present

## 2020-07-26 DIAGNOSIS — J189 Pneumonia, unspecified organism: Secondary | ICD-10-CM | POA: Diagnosis not present

## 2020-07-26 DIAGNOSIS — R059 Cough, unspecified: Secondary | ICD-10-CM | POA: Diagnosis not present

## 2020-07-26 DIAGNOSIS — Z87891 Personal history of nicotine dependence: Secondary | ICD-10-CM | POA: Diagnosis not present

## 2020-07-26 DIAGNOSIS — Z7982 Long term (current) use of aspirin: Secondary | ICD-10-CM | POA: Diagnosis not present

## 2020-07-26 DIAGNOSIS — Z20822 Contact with and (suspected) exposure to covid-19: Secondary | ICD-10-CM | POA: Insufficient documentation

## 2020-07-26 LAB — CBC WITH DIFFERENTIAL/PLATELET
Abs Immature Granulocytes: 0.65 10*3/uL — ABNORMAL HIGH (ref 0.00–0.07)
Basophils Absolute: 0.1 10*3/uL (ref 0.0–0.1)
Basophils Relative: 1 %
Eosinophils Absolute: 0 10*3/uL (ref 0.0–0.5)
Eosinophils Relative: 0 %
HCT: 44.4 % (ref 39.0–52.0)
Hemoglobin: 14.6 g/dL (ref 13.0–17.0)
Immature Granulocytes: 5 %
Lymphocytes Relative: 11 %
Lymphs Abs: 1.6 10*3/uL (ref 0.7–4.0)
MCH: 29.8 pg (ref 26.0–34.0)
MCHC: 32.9 g/dL (ref 30.0–36.0)
MCV: 90.6 fL (ref 80.0–100.0)
Monocytes Absolute: 0.8 10*3/uL (ref 0.1–1.0)
Monocytes Relative: 6 %
Neutro Abs: 10.8 10*3/uL — ABNORMAL HIGH (ref 1.7–7.7)
Neutrophils Relative %: 77 %
Platelets: 173 10*3/uL (ref 150–400)
RBC: 4.9 MIL/uL (ref 4.22–5.81)
RDW: 13.1 % (ref 11.5–15.5)
WBC: 13.9 10*3/uL — ABNORMAL HIGH (ref 4.0–10.5)
nRBC: 0 % (ref 0.0–0.2)

## 2020-07-26 LAB — BASIC METABOLIC PANEL
Anion gap: 10 (ref 5–15)
BUN: 13 mg/dL (ref 6–20)
CO2: 27 mmol/L (ref 22–32)
Calcium: 8.9 mg/dL (ref 8.9–10.3)
Chloride: 100 mmol/L (ref 98–111)
Creatinine, Ser: 1.15 mg/dL (ref 0.61–1.24)
GFR, Estimated: >60 mL/min (ref >60)
Glucose, Bld: 119 mg/dL — ABNORMAL HIGH (ref 70–99)
Potassium: 3.4 mmol/L — ABNORMAL LOW (ref 3.5–5.1)
Sodium: 137 mmol/L (ref 135–145)

## 2020-07-26 LAB — RESP PANEL BY RT-PCR (FLU A&B, COVID) ARPGX2
Influenza A by PCR: NEGATIVE
Influenza B by PCR: NEGATIVE
SARS Coronavirus 2 by RT PCR: NEGATIVE

## 2020-07-26 MED ORDER — AMOXICILLIN-POT CLAVULANATE 875-125 MG PO TABS: 1.0000 | ORAL_TABLET | Freq: Once | ORAL | Status: DC

## 2020-07-26 MED ORDER — AZITHROMYCIN 250 MG PO TABS
500.0000 mg | ORAL_TABLET | Freq: Once | ORAL | Status: AC
  Administered 2020-07-26: 500 mg via ORAL
  Filled 2020-07-26: qty 2

## 2020-07-26 MED ORDER — BENZONATATE 100 MG PO CAPS: 100.0000 mg | ORAL_CAPSULE | Freq: Four times a day (QID) | ORAL | 0 refills | Status: DC | PRN

## 2020-07-26 MED ORDER — AMOXICILLIN-POT CLAVULANATE 875-125 MG PO TABS: 1.0000 | ORAL_TABLET | Freq: Two times a day (BID) | ORAL | 0 refills | Status: AC

## 2020-07-26 MED ORDER — AEROCHAMBER PLUS FLO-VU MEDIUM MISC: 1.0000 | Freq: Once | Status: DC

## 2020-07-26 MED ORDER — AMOXICILLIN-POT CLAVULANATE 875-125 MG PO TABS
1.0000 | ORAL_TABLET | Freq: Once | ORAL | Status: AC
  Administered 2020-07-26: 1 via ORAL
  Filled 2020-07-26: qty 1

## 2020-07-26 MED ORDER — IOHEXOL 300 MG/ML  SOLN
75.0000 mL | Freq: Once | INTRAMUSCULAR | Status: AC | PRN
  Administered 2020-07-26: 75 mL via INTRAVENOUS

## 2020-07-26 MED ORDER — ALBUTEROL SULFATE HFA 108 (90 BASE) MCG/ACT IN AERS: 2.0000 | INHALATION_SPRAY | Freq: Four times a day (QID) | RESPIRATORY_TRACT | 2 refills | Status: DC | PRN

## 2020-07-26 MED ORDER — AZITHROMYCIN 250 MG PO TABS: ORAL_TABLET | ORAL | 0 refills | Status: AC

## 2020-07-26 MED ORDER — SODIUM CHLORIDE 0.9 % IV SOLN
1.0000 g | Freq: Once | INTRAVENOUS | Status: AC
  Administered 2020-07-26: 1 g via INTRAVENOUS
  Filled 2020-07-26: qty 10

## 2020-07-26 MED ORDER — ALBUTEROL SULFATE HFA 108 (90 BASE) MCG/ACT IN AERS
2.0000 | INHALATION_SPRAY | Freq: Once | RESPIRATORY_TRACT | Status: AC
  Administered 2020-07-26: 2 via RESPIRATORY_TRACT
  Filled 2020-07-26: qty 6.7

## 2020-07-26 NOTE — ED Provider Notes (Signed)
[facility] EMERGENCY DEPARTMENT Provider Note   CSN: [id number] Arrival date & time: 07/26/20  1134     History Chief Complaint  Patient presents with  . Shortness of Breath    Blake Kelly is a 49 y.o. male.  HPI   This patient is a 49 year old male, he presents to the hospital today with a complaint of a cough with associated shortness of breath, left-sided chest pain and left shoulder pain.  This is been going on for several days, he initially had subjective fevers and chills, temperature max was 100.3.  He is coughing and occasionally gets green phlegm.  He is otherwise immunocompetent, he has been vaccinated for COVID-19, symptoms are persistent and gradually worsening prompting his ER visit today.  He does have a history of getting pneumonia in the past.  He works as a Product manager, he is around lots of dust, horse hair and other allergens and admits that he regularly wheezes before bed though never being diagnosed with lung disease heart disease or blood clots  Past Medical History:  Diagnosis Date  . Erectile dysfunction     Patient Active Problem List   Diagnosis Date Noted  . Transaminitis 11/09/2016  . Elevated CK 11/09/2016  . Bilateral hand pain 11/08/2016  . Thickening, skin 11/08/2016  . Overweight 03/07/2015  . History of fractured pelvis 03/07/2015  . Lumbago 03/07/2015  . Erectile dysfunction 03/07/2015    Past Surgical History:  Procedure Laterality Date  .  pelvis fracture repair    . PELVIC FRACTURE SURGERY         History reviewed. No pertinent family history.  Social History   Tobacco Use  . Smoking status: Former Research scientist (life sciences)  . Smokeless tobacco: Never Used  Substance Use Topics  . Alcohol use: No    Alcohol/week: 0.0 standard drinks  . Drug use: No    Home Medications Prior to Admission medications   Medication Sig Start Date End Date Taking? Authorizing Provider  albuterol (VENTOLIN HFA) 108 (90 Base) MCG/ACT inhaler Inhale 2 puffs into  the lungs every 6 (six) hours as needed for wheezing or shortness of breath. 07/26/20   Fransico Meadow, PA-C  AMBULATORY NON FORMULARY MEDICATION tumeric    [provider]  AMBULATORY NON FORMULARY MEDICATION libidomax    [provider]  amoxicillin-clavulanate (AUGMENTIN) 875-125 MG tablet Take 1 tablet by mouth every 12 (twelve) hours for 10 days. 07/26/20 08/05/20  Fransico Meadow, PA-C  Aspirin-Salicylamide-Caffeine (BC HEADACHE POWDER PO) Take by mouth.    [provider]  azithromycin (ZITHROMAX Z-PAK) 250 MG tablet Take 2 tablets (500 mg) on  Day 1,  followed by 1 tablet (250 mg) once daily on Days 2 through 5. 07/26/20 07/31/20  Fransico Meadow, PA-C  benzonatate (TESSALON PERLES) 100 MG capsule Take 1 capsule (100 mg total) by mouth every 6 (six) hours as needed for cough. 07/26/20 07/26/21  Fransico Meadow, PA-C  diclofenac sodium (VOLTAREN) 1 % GEL Apply 4 g topically 4 (four) times daily. To affected joint. 11/08/16   Gregor Hams, MD  fluocinonide ointment (LIDEX) 4.65 % Apply 1 application topically 2 (two) times daily. 11/08/16   Gregor Hams, MD  Glucosamine-Chondroitin (MOVE FREE PO) Take by mouth.    [provider]  omeprazole (PRILOSEC) 40 MG capsule Take 1 capsule (40 mg total) by mouth daily. 03/07/15   Gregor Hams, MD  Specialty Vitamins Products (MAGNESIUM, AMINO ACID CHELATE,) 133 MG tablet Take 1 tablet  by mouth 2 (two) times daily.    [provider]    Allergies    Patient has no known allergies.  Review of Systems   Review of Systems  All other systems reviewed and are negative.   Physical Exam Updated Vital Signs BP 131/86   Pulse 73   Resp 18   Ht 1.829 m (6')   Wt 99.8 kg   SpO2 96%   BMI 29.84 kg/m   Physical Exam Vitals and nursing note reviewed.  Constitutional:      General: He is not in acute distress.    Appearance: He is well-developed.  HENT:     Head: Normocephalic and atraumatic.      Mouth/Throat:     Pharynx: No oropharyngeal exudate.  Eyes:     General: No scleral icterus.       Right eye: No discharge.        Left eye: No discharge.     Conjunctiva/sclera: Conjunctivae normal.     Pupils: Pupils are equal, round, and reactive to light.  Neck:     Thyroid: No thyromegaly.     Vascular: No JVD.  Cardiovascular:     Rate and Rhythm: Normal rate and regular rhythm.     Heart sounds: Normal heart sounds. No murmur heard.  No friction rub. No gallop.   Pulmonary:     Effort: Pulmonary effort is normal. No respiratory distress.     Breath sounds: Rales present. No wheezing.     Comments: Rales at the left base Abdominal:     General: Bowel sounds are normal. There is no distension.     Palpations: Abdomen is soft. There is no mass.     Tenderness: There is no abdominal tenderness.  Musculoskeletal:        General: No tenderness. Normal range of motion.     Cervical back: Normal range of motion and neck supple.  Lymphadenopathy:     Cervical: No cervical adenopathy.  Skin:    General: Skin is warm and dry.     Findings: No erythema or rash.  Neurological:     Mental Status: He is alert.     Coordination: Coordination normal.  Psychiatric:        Behavior: Behavior normal.     ED Results / Procedures / Treatments   Labs (all labs ordered are listed, but only abnormal results are displayed) Labs Reviewed  CBC WITH DIFFERENTIAL/PLATELET - Abnormal; Notable for the following components:      Result Value   WBC 13.9 (*)    Neutro Abs 10.8 (*)    Abs Immature Granulocytes 0.65 (*)    All other components within normal limits  BASIC METABOLIC PANEL - Abnormal; Notable for the following components:   Potassium 3.4 (*)    Glucose, Bld 119 (*)    All other components within normal limits  RESP PANEL BY RT-PCR (FLU A&B, COVID) ARPGX2    EKG None  Radiology No results found.  Procedures Procedures (including critical care time)  Medications Ordered  in ED Medications  albuterol (VENTOLIN HFA) 108 (90 Base) MCG/ACT inhaler 2 puff (2 puffs Inhalation Given 07/26/20 1214)  amoxicillin-clavulanate (AUGMENTIN) 875-125 MG per tablet 1 tablet (1 tablet Oral Given 07/26/20 1248)  azithromycin (ZITHROMAX) tablet 500 mg (500 mg Oral Given 07/26/20 1248)  iohexol (OMNIPAQUE) 300 MG/ML solution 75 mL (75 mLs Intravenous Contrast Given 07/26/20 1404)  cefTRIAXone (ROCEPHIN) 1 g in sodium chloride 0.9 % 100 mL  IVPB (0 g Intravenous Stopped 07/26/20 1550)    ED Course  I have reviewed the triage vital signs and the nursing notes.  Pertinent labs & imaging results that were available during my care of the patient were reviewed by me and considered in my medical decision making (see chart for details).    MDM Rules/Calculators/A&P                          The patient's exam is rather unremarkable except for the rales.  He clearly has some type of infiltrate or pulmonary process and with his coughing and green phlegm with associated chills and low-grade fever I suspect there is a bacterial pneumonia.  Will rule out Covid, check a chest x-ray to evaluate for bacterial pneumonia, he will be started on antibiotic as well as albuterol with a spacer, he is agreeable, his vital signs are reassuring, no tachycardia, no hypoxia, stable for discharge.  This patient ends up having multiple infiltrates on his x-ray, he does have a leukocytosis but is not tachycardic hypoxic or even febrile.  He looks well, he is amenable to discharge with antibiotics, I think that is reasonable, Covid is negative.  The patient was able to express his understanding of the indications for return.  Jobin Montelongo was evaluated in Emergency Department on 07/29/2020 for the symptoms described in the history of present illness. He was evaluated in the context of the global COVID-19 pandemic, which necessitated consideration that the patient might be at risk for infection with the SARS-CoV-2 virus  that causes COVID-19. Institutional protocols and algorithms that pertain to the evaluation of patients at risk for COVID-19 are in a state of rapid change based on information released by regulatory bodies including the CDC and federal and state organizations. These policies and algorithms were followed during the patient's care in the ED.   Final Clinical Impression(s) / ED Diagnoses Final diagnoses:  Community acquired pneumonia, unspecified laterality    Rx / DC Orders ED Discharge Orders         Ordered    albuterol (VENTOLIN HFA) 108 (90 Base) MCG/ACT inhaler  Every 6 hours PRN        07/26/20 1432    azithromycin (ZITHROMAX Z-PAK) 250 MG tablet        07/26/20 1432    amoxicillin-clavulanate (AUGMENTIN) 875-125 MG tablet  Every 12 hours        07/26/20 1437    benzonatate (TESSALON PERLES) 100 MG capsule  Every 6 hours PRN        07/26/20 1440           Noemi Chapel, MD 07/29/20 [phone]

## 2020-07-26 NOTE — ED Triage Notes (Signed)
Pt c/o of sob and cough x several days.

## 2020-07-26 NOTE — Discharge Instructions (Addendum)
Albuterol 2 puffs every 4 hours. Tylenol every 4 hours for fever.  Drink plenty of fluids.  Your covid and influenza test are negative.

## 2020-08-04 ENCOUNTER — Encounter: Payer: Self-pay | Admitting: Medical-Surgical

## 2020-08-04 ENCOUNTER — Other Ambulatory Visit: Payer: Self-pay

## 2020-08-04 ENCOUNTER — Ambulatory Visit (INDEPENDENT_AMBULATORY_CARE_PROVIDER_SITE_OTHER): Payer: BC Managed Care – PPO | Admitting: Medical-Surgical

## 2020-08-04 VITALS — BP 107/73 | HR 79 | Temp 98.8°F | Ht 71.5 in | Wt 223.6 lb

## 2020-08-04 DIAGNOSIS — M9903 Segmental and somatic dysfunction of lumbar region: Secondary | ICD-10-CM | POA: Diagnosis not present

## 2020-08-04 DIAGNOSIS — J189 Pneumonia, unspecified organism: Secondary | ICD-10-CM | POA: Insufficient documentation

## 2020-08-04 DIAGNOSIS — M1990 Unspecified osteoarthritis, unspecified site: Secondary | ICD-10-CM | POA: Insufficient documentation

## 2020-08-04 DIAGNOSIS — M9904 Segmental and somatic dysfunction of sacral region: Secondary | ICD-10-CM | POA: Diagnosis not present

## 2020-08-04 DIAGNOSIS — M9905 Segmental and somatic dysfunction of pelvic region: Secondary | ICD-10-CM | POA: Diagnosis not present

## 2020-08-04 DIAGNOSIS — Z1159 Encounter for screening for other viral diseases: Secondary | ICD-10-CM

## 2020-08-04 DIAGNOSIS — M5137 Other intervertebral disc degeneration, lumbosacral region: Secondary | ICD-10-CM | POA: Diagnosis not present

## 2020-08-04 DIAGNOSIS — Z114 Encounter for screening for human immunodeficiency virus [HIV]: Secondary | ICD-10-CM | POA: Diagnosis not present

## 2020-08-04 DIAGNOSIS — Z7689 Persons encountering health services in other specified circumstances: Secondary | ICD-10-CM | POA: Diagnosis not present

## 2020-08-04 MED ORDER — MELOXICAM 15 MG PO TABS: 15.0000 mg | ORAL_TABLET | Freq: Every day | ORAL | 1 refills | Status: DC

## 2020-08-04 NOTE — Patient Instructions (Addendum)
Voltaren gel  Meloxicam- take 1 tab daily, avoid taking other ibuprofen containing medications with this  Consider trying Capsaicin cream, fish oil, glucosamine/chondroitin

## 2020-08-04 NOTE — Progress Notes (Signed)
New Patient Office Visit  Subjective:  Patient ID: Blake Kelly, male    DOB: [birth year]  Age: 49 y.o. MRN: [id number]  CC:  Chief Complaint  Patient presents with  . Establish Care    Former patient of Dr. Georgina Snell  . Arthritis  . Pneumonia    HPI Blake Kelly presents to establish care.   Arthritis- history of arthritis that affects both hands, shoulders, and hips. 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 Has had more swelling in his knuckles with the middle right finger being the worst. Was previously taking BC powders with Omeprazole but stopped the [facility] powders almost 2 weeks ago.   PNA- recently diagnosed with pneumonia (12/4) and is currently finishing the Augmentin he was prescribed. Still having some dyspnea on exertion and decreased tolerance for activity. Using the Albuterol inhaler about 3 times daily right now. Sleeping more than usual, especially after working all morning. Having some left chest discomfort that also affects his left shoulder. Has had this since he was diagnosed with PNA.   Past Medical History:  Diagnosis Date  . Arthritis   . Erectile dysfunction     Past Surgical History:  Procedure Laterality Date  .  pelvis fracture repair    . PELVIC FRACTURE SURGERY      Family History  Problem Relation Age of Onset  . Diverticulitis Father   . Diverticulitis Paternal Aunt     Social History   Socioeconomic History  . Marital status: Married    Spouse name: Not on file  . Number of children: Not on file  . Years of education: Not on file  . Highest education level: Not on file  Occupational History  . Not on file  Tobacco Use  . Smoking status: Former Research scientist (life sciences)  . Smokeless tobacco: Never Used  Vaping Use  . Vaping Use: Never used  Substance and Sexual Activity  . Alcohol use: No    Alcohol/week: 0.0 standard drinks  . Drug use: No  . Sexual activity: Yes    Partners: Female    Birth control/protection:  Surgical  Other Topics Concern  . Not on file  Social History Narrative  . Not on file   Social Determinants of Health   Financial Resource Strain: Not on file  Food Insecurity: Not on file  Transportation Needs: Not on file  Physical Activity: Not on file  Stress: Not on file  Social Connections: Not on file  Intimate Partner Violence: Not on file    ROS Review of Systems  Constitutional: Negative for chills and fatigue.  Respiratory: Positive for cough (early AM) and shortness of breath. Negative for chest tightness.   Cardiovascular: Negative for chest pain, palpitations and leg swelling.  Gastrointestinal: Negative for blood in stool.  Musculoskeletal: Positive for arthralgias (left shoulder, bilateral hands).  Allergic/Immunologic: Negative for environmental allergies.  Neurological: Positive for headaches.  Psychiatric/Behavioral: Negative for dysphoric mood, self-injury, sleep disturbance and suicidal ideas. The patient is not nervous/anxious.     Objective:   Today's Vitals: BP 107/73   Pulse 79   Temp 98.8 F (37.1 C)   Ht 5' 11.5" (1.816 m)   Wt 223 lb 9.6 oz (101.4 kg)   SpO2 96%   BMI 30.75 kg/m   Physical Exam Vitals and nursing note reviewed.  Constitutional:      General: He is not in acute distress.    Appearance: Normal appearance.  HENT:  Head: Normocephalic and atraumatic.  Cardiovascular:     Rate and Rhythm: Normal rate and regular rhythm.     Pulses: Normal pulses.     Heart sounds: Normal heart sounds. No murmur heard. No friction rub. No gallop.   Pulmonary:     Effort: Pulmonary effort is normal. No respiratory distress.     Breath sounds: Normal breath sounds.  Musculoskeletal:        General: Swelling (bilateral kuckles) present.  Skin:    General: Skin is warm and dry.  Neurological:     Mental Status: He is alert and oriented to person, place, and time.  Psychiatric:        Mood and Affect: Mood normal.        Behavior:  Behavior normal.        Thought Content: Thought content normal.        Judgment: Judgment normal.    Assessment & Plan:   1. Encounter to establish care Reviewed available information and discussed concerns with patient. He is due for preventative care.   2. Screening for HIV (human immunodeficiency virus) Deferring today per patient request.   3. Need for hepatitis C screening test Deferring today per patient request.   4. Community acquired pneumonia, unspecified laterality Continue Augmentin as prescribed. Use Albuterol inhaler as needed. Chest x-ray ordered to obtain in one week.  - DG Chest 2 View; Future  5. Arthritis Reviewed previous blood work which shows a low positive ANA, elevated CK. All others normal. Start Meloxicam 15mg  daily. Consider possible PT to help with mobility, strength, etc. Topical options such as Voltaren and Capsaicin discussed. Also reviewed OTC options that may be helpful such as glucosamine chondroitin, fish oil, etc.   Outpatient Encounter Medications as of 08/04/2020  Medication Sig  . albuterol (VENTOLIN HFA) 108 (90 Base) MCG/ACT inhaler Inhale 2 puffs into the lungs every 6 (six) hours as needed for wheezing or shortness of breath.  Marland Kitchen amoxicillin-clavulanate (AUGMENTIN) 875-125 MG tablet Take 1 tablet by mouth every 12 (twelve) hours for 10 days.  Marland Kitchen MAGNESIUM PO Take by mouth daily as needed.  Marland Kitchen omeprazole (PRILOSEC) 40 MG capsule Take 1 capsule (40 mg total) by mouth daily.  . meloxicam (MOBIC) 15 MG tablet Take 1 tablet (15 mg total) by mouth daily.  . [DISCONTINUED] AMBULATORY NON FORMULARY MEDICATION tumeric (Patient not taking: Reported on 08/04/2020)  . [DISCONTINUED] AMBULATORY NON FORMULARY MEDICATION libidomax (Patient not taking: Reported on 08/04/2020)  . [DISCONTINUED] Aspirin-Salicylamide-Caffeine (BC HEADACHE POWDER PO) Take by mouth. (Patient not taking: Reported on 08/04/2020)  . [DISCONTINUED] benzonatate (TESSALON PERLES) 100  MG capsule Take 1 capsule (100 mg total) by mouth every 6 (six) hours as needed for cough. (Patient not taking: Reported on 08/04/2020)  . [DISCONTINUED] diclofenac sodium (VOLTAREN) 1 % GEL Apply 4 g topically 4 (four) times daily. To affected joint. (Patient not taking: Reported on 08/04/2020)  . [DISCONTINUED] fluocinonide ointment (LIDEX) 8.67 % Apply 1 application topically 2 (two) times daily. (Patient not taking: Reported on 08/04/2020)  . [DISCONTINUED] Glucosamine-Chondroitin (MOVE FREE PO) Take by mouth. (Patient not taking: Reported on 08/04/2020)  . [DISCONTINUED] Specialty Vitamins Products (MAGNESIUM, AMINO ACID CHELATE,) 133 MG tablet Take 1 tablet by mouth 2 (two) times daily. (Patient not taking: Reported on 08/04/2020)   No facility-administered encounter medications on file as of 08/04/2020.   Follow-up: Return for annual physical exam at your convenience.   Clearnce Sorrel, DNP, APRN, FNP-BC [facility]

## 2020-08-07 DIAGNOSIS — M9905 Segmental and somatic dysfunction of pelvic region: Secondary | ICD-10-CM | POA: Diagnosis not present

## 2020-08-07 DIAGNOSIS — M9903 Segmental and somatic dysfunction of lumbar region: Secondary | ICD-10-CM | POA: Diagnosis not present

## 2020-08-07 DIAGNOSIS — M9904 Segmental and somatic dysfunction of sacral region: Secondary | ICD-10-CM | POA: Diagnosis not present

## 2020-08-07 DIAGNOSIS — M5137 Other intervertebral disc degeneration, lumbosacral region: Secondary | ICD-10-CM | POA: Diagnosis not present

## 2020-08-14 ENCOUNTER — Other Ambulatory Visit: Payer: Self-pay

## 2020-08-14 ENCOUNTER — Ambulatory Visit (INDEPENDENT_AMBULATORY_CARE_PROVIDER_SITE_OTHER): Payer: BC Managed Care – PPO

## 2020-08-14 DIAGNOSIS — J189 Pneumonia, unspecified organism: Secondary | ICD-10-CM | POA: Diagnosis not present

## 2020-08-14 DIAGNOSIS — J90 Pleural effusion, not elsewhere classified: Secondary | ICD-10-CM | POA: Diagnosis not present

## 2020-08-17 ENCOUNTER — Encounter: Payer: Self-pay | Admitting: Medical-Surgical

## 2020-08-18 ENCOUNTER — Encounter: Payer: Self-pay | Admitting: Medical-Surgical

## 2020-08-27 DIAGNOSIS — M9903 Segmental and somatic dysfunction of lumbar region: Secondary | ICD-10-CM | POA: Diagnosis not present

## 2020-08-27 DIAGNOSIS — M9904 Segmental and somatic dysfunction of sacral region: Secondary | ICD-10-CM | POA: Diagnosis not present

## 2020-08-27 DIAGNOSIS — M9905 Segmental and somatic dysfunction of pelvic region: Secondary | ICD-10-CM | POA: Diagnosis not present

## 2020-08-27 DIAGNOSIS — M5137 Other intervertebral disc degeneration, lumbosacral region: Secondary | ICD-10-CM | POA: Diagnosis not present

## 2020-09-15 DIAGNOSIS — M9904 Segmental and somatic dysfunction of sacral region: Secondary | ICD-10-CM | POA: Diagnosis not present

## 2020-09-15 DIAGNOSIS — M9903 Segmental and somatic dysfunction of lumbar region: Secondary | ICD-10-CM | POA: Diagnosis not present

## 2020-09-15 DIAGNOSIS — M5137 Other intervertebral disc degeneration, lumbosacral region: Secondary | ICD-10-CM | POA: Diagnosis not present

## 2020-09-15 DIAGNOSIS — M9905 Segmental and somatic dysfunction of pelvic region: Secondary | ICD-10-CM | POA: Diagnosis not present

## 2020-09-30 ENCOUNTER — Other Ambulatory Visit: Payer: Self-pay | Admitting: *Deleted

## 2020-09-30 ENCOUNTER — Encounter: Payer: Self-pay | Admitting: Medical-Surgical

## 2020-09-30 MED ORDER — MELOXICAM 15 MG PO TABS: 15.0000 mg | ORAL_TABLET | Freq: Every day | ORAL | 2 refills | Status: DC

## 2020-10-01 DIAGNOSIS — M9904 Segmental and somatic dysfunction of sacral region: Secondary | ICD-10-CM | POA: Diagnosis not present

## 2020-10-01 DIAGNOSIS — M9903 Segmental and somatic dysfunction of lumbar region: Secondary | ICD-10-CM | POA: Diagnosis not present

## 2020-10-01 DIAGNOSIS — M9905 Segmental and somatic dysfunction of pelvic region: Secondary | ICD-10-CM | POA: Diagnosis not present

## 2020-10-01 DIAGNOSIS — M5431 Sciatica, right side: Secondary | ICD-10-CM | POA: Diagnosis not present

## 2020-10-02 DIAGNOSIS — M9903 Segmental and somatic dysfunction of lumbar region: Secondary | ICD-10-CM | POA: Diagnosis not present

## 2020-10-02 DIAGNOSIS — M9904 Segmental and somatic dysfunction of sacral region: Secondary | ICD-10-CM | POA: Diagnosis not present

## 2020-10-02 DIAGNOSIS — M9905 Segmental and somatic dysfunction of pelvic region: Secondary | ICD-10-CM | POA: Diagnosis not present

## 2020-10-02 DIAGNOSIS — M5431 Sciatica, right side: Secondary | ICD-10-CM | POA: Diagnosis not present

## 2020-10-06 DIAGNOSIS — M9905 Segmental and somatic dysfunction of pelvic region: Secondary | ICD-10-CM | POA: Diagnosis not present

## 2020-10-06 DIAGNOSIS — M5431 Sciatica, right side: Secondary | ICD-10-CM | POA: Diagnosis not present

## 2020-10-06 DIAGNOSIS — M9903 Segmental and somatic dysfunction of lumbar region: Secondary | ICD-10-CM | POA: Diagnosis not present

## 2020-10-06 DIAGNOSIS — M9904 Segmental and somatic dysfunction of sacral region: Secondary | ICD-10-CM | POA: Diagnosis not present

## 2020-10-14 ENCOUNTER — Encounter: Payer: Self-pay | Admitting: Family Medicine

## 2020-10-14 ENCOUNTER — Telehealth (INDEPENDENT_AMBULATORY_CARE_PROVIDER_SITE_OTHER): Payer: BC Managed Care – PPO | Admitting: Family Medicine

## 2020-10-14 DIAGNOSIS — K529 Noninfective gastroenteritis and colitis, unspecified: Secondary | ICD-10-CM | POA: Diagnosis not present

## 2020-10-14 NOTE — Patient Instructions (Signed)

## 2020-10-14 NOTE — Progress Notes (Signed)
Pt stated it started on Friday having diarrhea after eating about 20- 30 starts to vomit can not hold anything on the stomach has hot flashes feeling drain out   OTC Pepto bismol and Imodium.

## 2020-10-14 NOTE — Progress Notes (Signed)
Virtual Video Visit via MyChart Note  I connected with  Blake Kelly on 10/14/20 at  9:10 AM EST by the video enabled telemedicine application for , MyChart, and verified that I am speaking with the correct person using two identifiers.   I introduced myself as a Designer, jewellery with the practice. We discussed the limitations of evaluation and management by telemedicine and the availability of in person appointments. The patient expressed understanding and agreed to proceed.  Participating parties in this visit include: The patient and the nurse practitioner listed.  The patient is: At home I am: In the office - Primary Care Blake Kelly   Subjective:    CC:  Chief Complaint  Patient presents with  . Diarrhea    HPI: Blake Kelly is a 50 y.o. year old male presenting today via [place] today for diarrhea and some vomiting.  Last Friday around lunchtime patient stated he had one episode of diarrhea.  After lunch he did vomit once and throughout the night he felt some hot flashes and had more diarrhea.  He reported he had several episodes of diarrhea Saturday and did not have any food intake but did stay hydrated.  He woke up twice during the night with diarrhea episodes.  Sunday he continued to have diarrhea, though less frequently; he only woke up once during the night.  Today he reports he had one episode of diarrhea early this morning, but has since eaten breakfast of toast, mashed potatoes, ginger ale.  He has not yet gotten sick after eating this today.  He reports he has never had any abdominal pain, just churning occasionally.  He has been taking occasional Imodium, Pepto-Bismol, and has been staying hydrated with water, ginger ale, Gatorade.  He states today he feels good overall but he knows that once he starts moving around a lot he does feel weak since he has not eaten much, and the diarrhea tends to pick up after he tries to be more active.  Patient denies fevers, chest pain,  shortness of breath, blood in stool or vomit, chills, abdominal pain, dizziness, lightheadedness, decreased urine output.    Past medical history, Surgical history, Family history not pertinant except as noted below, Social history, Allergies, and medications have been entered into the medical record, reviewed, and corrections made.   Review of Systems:  All review of systems negative except what is listed in the HPI   Objective:    General: Patient appears well overall, sitting on couch talking without any distress. Speaking clearly in complete sentences. Absent shortness of breath noted.   Alert and oriented x3.   Normal judgment.  Absent acute distress.   Impression and Recommendations:    1. Gastroenteritis Patient doing much better overall compared to Friday.  This sounds like viral gastroenteritis.  Recommend patient continue to slowly advance diet starting with liquids and advancing to a BRAT diet.  Encouraged him to stay hydrated and informed him he can continue taking occasional Imodium and Pepto-Bismol.  He also asked if he could take occasional Dramamine for motion sickness-this should be fine, but encouraged patient not to operate equipment as this could make him drowsy.  Educated patient on signs and symptoms requiring further evaluation including fevers and chills, blood in stool or vomit, sharp abdominal pain, decreased urine output or other signs of dehydration, chest pain and shortness of breath.  Patient to continue conservative treatment.  Follow-up if symptoms worsen or do not start improving over the next day or so.  I discussed the assessment and treatment plan with the patient. The patient was provided an opportunity to ask questions and all were answered. The patient agreed with the plan and demonstrated an understanding of the instructions.   The patient was advised to call back or seek an in-person evaluation if the symptoms worsen or if the condition fails  to improve as anticipated.  I provided 20 minutes of non-face-to-face interaction with this [place] visit including intake, same-day documentation, and chart review.   Terrilyn Saver, NP

## 2020-10-16 DIAGNOSIS — M5431 Sciatica, right side: Secondary | ICD-10-CM | POA: Diagnosis not present

## 2020-10-16 DIAGNOSIS — M9904 Segmental and somatic dysfunction of sacral region: Secondary | ICD-10-CM | POA: Diagnosis not present

## 2020-10-16 DIAGNOSIS — M9905 Segmental and somatic dysfunction of pelvic region: Secondary | ICD-10-CM | POA: Diagnosis not present

## 2020-10-16 DIAGNOSIS — M9903 Segmental and somatic dysfunction of lumbar region: Secondary | ICD-10-CM | POA: Diagnosis not present

## 2020-10-27 DIAGNOSIS — M9903 Segmental and somatic dysfunction of lumbar region: Secondary | ICD-10-CM | POA: Diagnosis not present

## 2020-10-27 DIAGNOSIS — M9905 Segmental and somatic dysfunction of pelvic region: Secondary | ICD-10-CM | POA: Diagnosis not present

## 2020-10-27 DIAGNOSIS — M5431 Sciatica, right side: Secondary | ICD-10-CM | POA: Diagnosis not present

## 2020-10-27 DIAGNOSIS — M9904 Segmental and somatic dysfunction of sacral region: Secondary | ICD-10-CM | POA: Diagnosis not present

## 2021-01-03 ENCOUNTER — Other Ambulatory Visit: Payer: Self-pay | Admitting: Medical-Surgical

## 2021-01-12 DIAGNOSIS — M9904 Segmental and somatic dysfunction of sacral region: Secondary | ICD-10-CM | POA: Diagnosis not present

## 2021-01-12 DIAGNOSIS — M9903 Segmental and somatic dysfunction of lumbar region: Secondary | ICD-10-CM | POA: Diagnosis not present

## 2021-01-12 DIAGNOSIS — M5431 Sciatica, right side: Secondary | ICD-10-CM | POA: Diagnosis not present

## 2021-01-12 DIAGNOSIS — M9905 Segmental and somatic dysfunction of pelvic region: Secondary | ICD-10-CM | POA: Diagnosis not present

## 2021-02-03 ENCOUNTER — Other Ambulatory Visit: Payer: Self-pay | Admitting: Medical-Surgical

## 2021-02-12 DIAGNOSIS — M5431 Sciatica, right side: Secondary | ICD-10-CM | POA: Diagnosis not present

## 2021-02-12 DIAGNOSIS — M9904 Segmental and somatic dysfunction of sacral region: Secondary | ICD-10-CM | POA: Diagnosis not present

## 2021-02-12 DIAGNOSIS — M9903 Segmental and somatic dysfunction of lumbar region: Secondary | ICD-10-CM | POA: Diagnosis not present

## 2021-02-12 DIAGNOSIS — M9905 Segmental and somatic dysfunction of pelvic region: Secondary | ICD-10-CM | POA: Diagnosis not present

## 2021-02-19 DIAGNOSIS — M9903 Segmental and somatic dysfunction of lumbar region: Secondary | ICD-10-CM | POA: Diagnosis not present

## 2021-02-19 DIAGNOSIS — M5431 Sciatica, right side: Secondary | ICD-10-CM | POA: Diagnosis not present

## 2021-02-19 DIAGNOSIS — M9904 Segmental and somatic dysfunction of sacral region: Secondary | ICD-10-CM | POA: Diagnosis not present

## 2021-02-19 DIAGNOSIS — M9905 Segmental and somatic dysfunction of pelvic region: Secondary | ICD-10-CM | POA: Diagnosis not present

## 2021-02-25 DIAGNOSIS — M9904 Segmental and somatic dysfunction of sacral region: Secondary | ICD-10-CM | POA: Diagnosis not present

## 2021-02-25 DIAGNOSIS — M5431 Sciatica, right side: Secondary | ICD-10-CM | POA: Diagnosis not present

## 2021-02-25 DIAGNOSIS — M9903 Segmental and somatic dysfunction of lumbar region: Secondary | ICD-10-CM | POA: Diagnosis not present

## 2021-02-25 DIAGNOSIS — M9905 Segmental and somatic dysfunction of pelvic region: Secondary | ICD-10-CM | POA: Diagnosis not present

## 2021-03-02 ENCOUNTER — Other Ambulatory Visit: Payer: Self-pay | Admitting: Medical-Surgical

## 2021-03-02 DIAGNOSIS — M9904 Segmental and somatic dysfunction of sacral region: Secondary | ICD-10-CM | POA: Diagnosis not present

## 2021-03-02 DIAGNOSIS — M9903 Segmental and somatic dysfunction of lumbar region: Secondary | ICD-10-CM | POA: Diagnosis not present

## 2021-03-02 DIAGNOSIS — M5431 Sciatica, right side: Secondary | ICD-10-CM | POA: Diagnosis not present

## 2021-03-02 DIAGNOSIS — M9905 Segmental and somatic dysfunction of pelvic region: Secondary | ICD-10-CM | POA: Diagnosis not present

## 2021-03-10 DIAGNOSIS — M9904 Segmental and somatic dysfunction of sacral region: Secondary | ICD-10-CM | POA: Diagnosis not present

## 2021-03-10 DIAGNOSIS — M5431 Sciatica, right side: Secondary | ICD-10-CM | POA: Diagnosis not present

## 2021-03-10 DIAGNOSIS — M9905 Segmental and somatic dysfunction of pelvic region: Secondary | ICD-10-CM | POA: Diagnosis not present

## 2021-03-10 DIAGNOSIS — M9903 Segmental and somatic dysfunction of lumbar region: Secondary | ICD-10-CM | POA: Diagnosis not present

## 2021-03-25 DIAGNOSIS — M9904 Segmental and somatic dysfunction of sacral region: Secondary | ICD-10-CM | POA: Diagnosis not present

## 2021-03-25 DIAGNOSIS — M5431 Sciatica, right side: Secondary | ICD-10-CM | POA: Diagnosis not present

## 2021-03-25 DIAGNOSIS — M9903 Segmental and somatic dysfunction of lumbar region: Secondary | ICD-10-CM | POA: Diagnosis not present

## 2021-03-25 DIAGNOSIS — M9905 Segmental and somatic dysfunction of pelvic region: Secondary | ICD-10-CM | POA: Diagnosis not present

## 2021-03-30 DIAGNOSIS — M9904 Segmental and somatic dysfunction of sacral region: Secondary | ICD-10-CM | POA: Diagnosis not present

## 2021-03-30 DIAGNOSIS — M5431 Sciatica, right side: Secondary | ICD-10-CM | POA: Diagnosis not present

## 2021-03-30 DIAGNOSIS — M9903 Segmental and somatic dysfunction of lumbar region: Secondary | ICD-10-CM | POA: Diagnosis not present

## 2021-03-30 DIAGNOSIS — M9905 Segmental and somatic dysfunction of pelvic region: Secondary | ICD-10-CM | POA: Diagnosis not present

## 2021-04-06 ENCOUNTER — Other Ambulatory Visit: Payer: Self-pay | Admitting: Medical-Surgical

## 2021-05-04 DIAGNOSIS — M9905 Segmental and somatic dysfunction of pelvic region: Secondary | ICD-10-CM | POA: Diagnosis not present

## 2021-05-04 DIAGNOSIS — M9904 Segmental and somatic dysfunction of sacral region: Secondary | ICD-10-CM | POA: Diagnosis not present

## 2021-05-04 DIAGNOSIS — M9903 Segmental and somatic dysfunction of lumbar region: Secondary | ICD-10-CM | POA: Diagnosis not present

## 2021-05-04 DIAGNOSIS — M5431 Sciatica, right side: Secondary | ICD-10-CM | POA: Diagnosis not present

## 2021-05-06 ENCOUNTER — Other Ambulatory Visit: Payer: Self-pay | Admitting: Medical-Surgical

## 2021-05-06 DIAGNOSIS — M9904 Segmental and somatic dysfunction of sacral region: Secondary | ICD-10-CM | POA: Diagnosis not present

## 2021-05-06 DIAGNOSIS — M5431 Sciatica, right side: Secondary | ICD-10-CM | POA: Diagnosis not present

## 2021-05-06 DIAGNOSIS — M9903 Segmental and somatic dysfunction of lumbar region: Secondary | ICD-10-CM | POA: Diagnosis not present

## 2021-05-06 DIAGNOSIS — M9905 Segmental and somatic dysfunction of pelvic region: Secondary | ICD-10-CM | POA: Diagnosis not present

## 2021-05-13 DIAGNOSIS — M9904 Segmental and somatic dysfunction of sacral region: Secondary | ICD-10-CM | POA: Diagnosis not present

## 2021-05-13 DIAGNOSIS — M9903 Segmental and somatic dysfunction of lumbar region: Secondary | ICD-10-CM | POA: Diagnosis not present

## 2021-05-13 DIAGNOSIS — M5431 Sciatica, right side: Secondary | ICD-10-CM | POA: Diagnosis not present

## 2021-05-13 DIAGNOSIS — M9905 Segmental and somatic dysfunction of pelvic region: Secondary | ICD-10-CM | POA: Diagnosis not present

## 2021-05-14 DIAGNOSIS — M9903 Segmental and somatic dysfunction of lumbar region: Secondary | ICD-10-CM | POA: Diagnosis not present

## 2021-05-14 DIAGNOSIS — M9905 Segmental and somatic dysfunction of pelvic region: Secondary | ICD-10-CM | POA: Diagnosis not present

## 2021-05-14 DIAGNOSIS — M5431 Sciatica, right side: Secondary | ICD-10-CM | POA: Diagnosis not present

## 2021-05-14 DIAGNOSIS — M9904 Segmental and somatic dysfunction of sacral region: Secondary | ICD-10-CM | POA: Diagnosis not present

## 2021-06-03 DIAGNOSIS — M9903 Segmental and somatic dysfunction of lumbar region: Secondary | ICD-10-CM | POA: Diagnosis not present

## 2021-06-03 DIAGNOSIS — M9905 Segmental and somatic dysfunction of pelvic region: Secondary | ICD-10-CM | POA: Diagnosis not present

## 2021-06-03 DIAGNOSIS — M5431 Sciatica, right side: Secondary | ICD-10-CM | POA: Diagnosis not present

## 2021-06-03 DIAGNOSIS — M9904 Segmental and somatic dysfunction of sacral region: Secondary | ICD-10-CM | POA: Diagnosis not present

## 2021-06-05 ENCOUNTER — Other Ambulatory Visit: Payer: Self-pay | Admitting: Medical-Surgical

## 2021-06-11 DIAGNOSIS — M9903 Segmental and somatic dysfunction of lumbar region: Secondary | ICD-10-CM | POA: Diagnosis not present

## 2021-06-11 DIAGNOSIS — M5431 Sciatica, right side: Secondary | ICD-10-CM | POA: Diagnosis not present

## 2021-06-11 DIAGNOSIS — M9904 Segmental and somatic dysfunction of sacral region: Secondary | ICD-10-CM | POA: Diagnosis not present

## 2021-06-11 DIAGNOSIS — M9905 Segmental and somatic dysfunction of pelvic region: Secondary | ICD-10-CM | POA: Diagnosis not present

## 2021-06-22 DIAGNOSIS — M9905 Segmental and somatic dysfunction of pelvic region: Secondary | ICD-10-CM | POA: Diagnosis not present

## 2021-06-22 DIAGNOSIS — M5431 Sciatica, right side: Secondary | ICD-10-CM | POA: Diagnosis not present

## 2021-06-22 DIAGNOSIS — M9904 Segmental and somatic dysfunction of sacral region: Secondary | ICD-10-CM | POA: Diagnosis not present

## 2021-06-22 DIAGNOSIS — M9903 Segmental and somatic dysfunction of lumbar region: Secondary | ICD-10-CM | POA: Diagnosis not present

## 2021-07-06 ENCOUNTER — Ambulatory Visit: Payer: BC Managed Care – PPO | Admitting: Medical-Surgical

## 2021-07-08 ENCOUNTER — Other Ambulatory Visit: Payer: Self-pay | Admitting: Medical-Surgical

## 2021-08-06 ENCOUNTER — Other Ambulatory Visit: Payer: Self-pay | Admitting: Medical-Surgical

## 2021-08-24 DIAGNOSIS — M5431 Sciatica, right side: Secondary | ICD-10-CM | POA: Diagnosis not present

## 2021-08-24 DIAGNOSIS — M9904 Segmental and somatic dysfunction of sacral region: Secondary | ICD-10-CM | POA: Diagnosis not present

## 2021-08-24 DIAGNOSIS — M9905 Segmental and somatic dysfunction of pelvic region: Secondary | ICD-10-CM | POA: Diagnosis not present

## 2021-08-24 DIAGNOSIS — M9903 Segmental and somatic dysfunction of lumbar region: Secondary | ICD-10-CM | POA: Diagnosis not present

## 2021-08-28 ENCOUNTER — Ambulatory Visit (INDEPENDENT_AMBULATORY_CARE_PROVIDER_SITE_OTHER): Payer: BC Managed Care – PPO | Admitting: Medical-Surgical

## 2021-08-28 ENCOUNTER — Other Ambulatory Visit: Payer: Self-pay

## 2021-08-28 ENCOUNTER — Encounter: Payer: Self-pay | Admitting: Medical-Surgical

## 2021-08-28 VITALS — BP 117/77 | HR 57 | Resp 20 | Ht 71.5 in | Wt 233.0 lb

## 2021-08-28 DIAGNOSIS — Z125 Encounter for screening for malignant neoplasm of prostate: Secondary | ICD-10-CM | POA: Diagnosis not present

## 2021-08-28 DIAGNOSIS — Z23 Encounter for immunization: Secondary | ICD-10-CM

## 2021-08-28 DIAGNOSIS — Z0000 Encounter for general adult medical examination without abnormal findings: Secondary | ICD-10-CM

## 2021-08-28 DIAGNOSIS — Z1211 Encounter for screening for malignant neoplasm of colon: Secondary | ICD-10-CM

## 2021-08-28 NOTE — Patient Instructions (Signed)
Anti-inflammatory diet  Preventive Care 36-52 Years Old, Male Preventive care refers to lifestyle choices and visits with your health care provider that can promote health and wellness. Preventive care visits are also called wellness exams. What can I expect for my preventive care visit? Counseling During your preventive care visit, your health care provider may ask about your: Medical history, including: Past medical problems. Family medical history. Current health, including: Emotional well-being. Home life and relationship well-being. Sexual activity. Lifestyle, including: Alcohol, nicotine or tobacco, and drug use. Access to firearms. Diet, exercise, and sleep habits. Safety issues such as seatbelt and bike helmet use. Sunscreen use. Work and work Statistician. Physical exam Your health care provider will check your: Height and weight. These may be used to calculate your BMI (body mass index). BMI is a measurement that tells if you are at a healthy weight. Waist circumference. This measures the distance around your waistline. This measurement also tells if you are at a healthy weight and may help predict your risk of certain diseases, such as type 2 diabetes and high blood pressure. Heart rate and blood pressure. Body temperature. Skin for abnormal spots. What immunizations do I need? Vaccines are usually given at various ages, according to a schedule. Your health care provider will recommend vaccines for you based on your age, medical history, and lifestyle or other factors, such as travel or where you work. What tests do I need? Screening Your health care provider may recommend screening tests for certain conditions. This may include: Lipid and cholesterol levels. Diabetes screening. This is done by checking your blood sugar (glucose) after you have not eaten for a while (fasting). Hepatitis B test. Hepatitis C test. HIV (human immunodeficiency virus) test. STI (sexually  transmitted infection) testing, if you are at risk. Lung cancer screening. Prostate cancer screening. Colorectal cancer screening. Talk with your health care provider about your test results, treatment options, and if necessary, the need for more tests. Follow these instructions at home: Eating and drinking  Eat a diet that includes fresh fruits and vegetables, whole grains, lean protein, and low-fat dairy products. Take vitamin and mineral supplements as recommended by your health care provider. Do not drink alcohol if your health care provider tells you not to drink. If you drink alcohol: Limit how much you have to 0-2 drinks a day. Know how much alcohol is in your drink. In the [place] one drink equals one 12 oz bottle of beer (355 mL), one 5 oz glass of wine (148 mL), or one 1 oz glass of hard liquor (44 mL). Lifestyle Brush your teeth every morning and night with fluoride toothpaste. Floss one time each day. Exercise for at least 30 minutes 5 or more days each week. Do not use any products that contain nicotine or tobacco. These products include cigarettes, chewing tobacco, and vaping devices, such as e-cigarettes. If you need help quitting, ask your health care provider. Do not use drugs. If you are sexually active, practice safe sex. Use a condom or other form of protection to prevent STIs. Take aspirin only as told by your health care provider. Make sure that you understand how much to take and what form to take. Work with your health care provider to find out whether it is safe and beneficial for you to take aspirin daily. Find healthy ways to manage stress, such as: Meditation, yoga, or listening to music. Journaling. Talking to a trusted person. Spending time with friends and family. Minimize exposure to UV radiation  to reduce your risk of skin cancer. Safety Always wear your seat belt while driving or riding in a vehicle. Do not drive: If you have been drinking alcohol. Do  not ride with someone who has been drinking. When you are tired or distracted. While texting. If you have been using any mind-altering substances or drugs. Wear a helmet and other protective equipment during sports activities. If you have firearms in your house, make sure you follow all gun safety procedures. What's next? Go to your health care provider once a year for an annual wellness visit. Ask your health care provider how often you should have your eyes and teeth checked. Stay up to date on all vaccines. This information is not intended to replace advice given to you by your health care provider. Make sure you discuss any questions you have with your health care provider. Document Revised: 02/04/2021 Document Reviewed: 02/04/2021 Elsevier Patient Education  [place].

## 2021-08-28 NOTE — Progress Notes (Addendum)
HPI: Blake Kelly is a 51 y.o. male who  has a past medical history of Arthritis and Erectile dysfunction.  he presents to [facility] today, 08/29/21,  for chief complaint of: Annual physical exam  Dentist: Every 6 months, up-to-date Eye exam: Up-to-date Exercise: Regular physical activity Diet: Has made some changes to cut out sugary drinks and sodas, no restrictions Colon cancer screening: Referring to GI today for colonoscopy Prostate cancer screening: Checking PSA today  COVID vaccine: Done, no booster  Concerns: None  Past medical, surgical, social and family history reviewed:  Patient Active Problem List   Diagnosis Date Noted   Arthritis 08/04/2020   Community acquired pneumonia 08/04/2020   Transaminitis 11/09/2016   Elevated CK 11/09/2016   Bilateral hand pain 11/08/2016   Thickening, skin 11/08/2016   Overweight 03/07/2015   History of fractured pelvis 03/07/2015   Lumbago 03/07/2015   Erectile dysfunction 03/07/2015    Past Surgical History:  Procedure Laterality Date    pelvis fracture repair     PELVIC FRACTURE SURGERY      Social History   Tobacco Use   Smoking status: Former   Smokeless tobacco: Never  Substance Use Topics   Alcohol use: No    Alcohol/week: 0.0 standard drinks    Family History  Problem Relation Age of Onset   Diverticulitis Father    Diverticulitis Paternal Aunt      Current medication list and allergy/intolerance information reviewed:    Current Outpatient Medications  Medication Sig Dispense Refill   albuterol (VENTOLIN HFA) 108 (90 Base) MCG/ACT inhaler Inhale 2 puffs into the lungs every 6 (six) hours as needed for wheezing or shortness of breath. 8 g 2   MAGNESIUM PO Take by mouth daily as needed.     meloxicam (MOBIC) 15 MG tablet Take 1 tablet by mouth once daily 30 tablet 0   omeprazole (PRILOSEC) 40 MG capsule Take 1 capsule (40 mg total) by mouth daily. 90 capsule 3   No  current facility-administered medications for this visit.    No Known Allergies    Review of Systems: Constitutional:  No  fever, no chills, No recent illness, No unintentional weight changes. No significant fatigue.  HEENT: No  headache, no vision change, no hearing change, No sore throat, No  sinus pressure Cardiac: No  chest pain, No  pressure, No palpitations, No  Orthopnea Respiratory:  No  shortness of breath. No  Cough Gastrointestinal: No  abdominal pain, No  nausea, No  vomiting,  No  blood in stool, No  diarrhea, No  constipation  Musculoskeletal: No new myalgia/arthralgia Skin: No  Rash, No other wounds/concerning lesions Genitourinary: No  incontinence, No  abnormal genital bleeding, No abnormal genital discharge Hem/Onc: No  easy bruising/bleeding, No  abnormal lymph node Endocrine: No cold intolerance,  No heat intolerance. No polyuria/polydipsia/polyphagia  Neurologic: No  weakness, No  dizziness, No  slurred speech/focal weakness/facial droop Psychiatric: No  concerns with depression, No  concerns with anxiety, No sleep problems, No mood problems  Exam:  BP 117/77 (BP Location: Left Arm, Patient Position: Sitting, Cuff Size: Large)    Pulse (!) 57    Resp 20    Ht 5' 11.5" (1.816 m)    Wt 233 lb (105.7 kg)    SpO2 98%    BMI 32.04 kg/m  Constitutional: VS see above. General Appearance: alert, well-developed, well-nourished, NAD Eyes: Normal lids and conjunctive, non-icteric sclera Ears, Nose, Mouth, Throat: MMM, Normal  external inspection ears/nares/mouth/lips/gums. TM normal bilaterally.   Neck: No masses, trachea midline. No thyroid enlargement. No tenderness/mass appreciated. No lymphadenopathy Respiratory: Normal respiratory effort. no wheeze, no rhonchi, no rales Cardiovascular: S1/S2 normal, no murmur, no rub/gallop auscultated. RRR. No lower extremity edema. No carotid bruit or JVD. No abdominal aortic bruit. Gastrointestinal: Nontender, no masses. No hepatomegaly,  no splenomegaly. No hernia appreciated. Bowel sounds normal. Rectal exam deferred.  Musculoskeletal: Gait normal. No clubbing/cyanosis of digits.  Neurological: Normal balance/coordination. No tremor. No cranial nerve deficit on limited exam. Motor and sensation intact and symmetric. Cerebellar reflexes intact.  Skin: warm, dry, intact. No rash/ulcer. No concerning nevi or subq nodules on limited exam.   Psychiatric: Normal judgment/insight. Normal mood and affect. Oriented x3.    ASSESSMENT/PLAN:   1. Annual physical exam Checking labs as below.  Wellness information provided with AVS.  Up-to-date on most preventative care. - CBC with Differential/Platelet - COMPLETE METABOLIC PANEL WITH GFR - Lipid panel  2. Prostate cancer screening Checking PSA today. - PSA  3. Colon cancer screening Referring to GI for colonoscopy. - Ambulatory referral to Gastroenterology  4. Need for shingles vaccine Discussed the risks versus benefits of getting the shingles vaccine.  Reviewed the percentage of vaccine effectiveness as well as the possible complications of getting shingles.  Patient would like to think about vaccination and will let me know if he wants to go forward with this.  Orders Placed This Encounter  Procedures   CBC with Differential/Platelet   COMPLETE METABOLIC PANEL WITH GFR   Lipid panel   PSA   Ambulatory referral to Gastroenterology    No orders of the defined types were placed in this encounter.   Patient Instructions  Anti-inflammatory diet  Preventive Care 37-20 Years Old, Male Preventive care refers to lifestyle choices and visits with your health care provider that can promote health and wellness. Preventive care visits are also called wellness exams. What can I expect for my preventive care visit? Counseling During your preventive care visit, your health care provider may ask about your: Medical history, including: Past medical problems. Family medical  history. Current health, including: Emotional well-being. Home life and relationship well-being. Sexual activity. Lifestyle, including: Alcohol, nicotine or tobacco, and drug use. Access to firearms. Diet, exercise, and sleep habits. Safety issues such as seatbelt and bike helmet use. Sunscreen use. Work and work Statistician. Physical exam Your health care provider will check your: Height and weight. These may be used to calculate your BMI (body mass index). BMI is a measurement that tells if you are at a healthy weight. Waist circumference. This measures the distance around your waistline. This measurement also tells if you are at a healthy weight and may help predict your risk of certain diseases, such as type 2 diabetes and high blood pressure. Heart rate and blood pressure. Body temperature. Skin for abnormal spots. What immunizations do I need? Vaccines are usually given at various ages, according to a schedule. Your health care provider will recommend vaccines for you based on your age, medical history, and lifestyle or other factors, such as travel or where you work. What tests do I need? Screening Your health care provider may recommend screening tests for certain conditions. This may include: Lipid and cholesterol levels. Diabetes screening. This is done by checking your blood sugar (glucose) after you have not eaten for a while (fasting). Hepatitis B test. Hepatitis C test. HIV (human immunodeficiency virus) test. STI (sexually transmitted infection) testing, if you are  at risk. Lung cancer screening. Prostate cancer screening. Colorectal cancer screening. Talk with your health care provider about your test results, treatment options, and if necessary, the need for more tests. Follow these instructions at home: Eating and drinking  Eat a diet that includes fresh fruits and vegetables, whole grains, lean protein, and low-fat dairy products. Take vitamin and mineral  supplements as recommended by your health care provider. Do not drink alcohol if your health care provider tells you not to drink. If you drink alcohol: Limit how much you have to 0-2 drinks a day. Know how much alcohol is in your drink. In the [place] one drink equals one 12 oz bottle of beer (355 mL), one 5 oz glass of wine (148 mL), or one 1 oz glass of hard liquor (44 mL). Lifestyle Brush your teeth every morning and night with fluoride toothpaste. Floss one time each day. Exercise for at least 30 minutes 5 or more days each week. Do not use any products that contain nicotine or tobacco. These products include cigarettes, chewing tobacco, and vaping devices, such as e-cigarettes. If you need help quitting, ask your health care provider. Do not use drugs. If you are sexually active, practice safe sex. Use a condom or other form of protection to prevent STIs. Take aspirin only as told by your health care provider. Make sure that you understand how much to take and what form to take. Work with your health care provider to find out whether it is safe and beneficial for you to take aspirin daily. Find healthy ways to manage stress, such as: Meditation, yoga, or listening to music. Journaling. Talking to a trusted person. Spending time with friends and family. Minimize exposure to UV radiation to reduce your risk of skin cancer. Safety Always wear your seat belt while driving or riding in a vehicle. Do not drive: If you have been drinking alcohol. Do not ride with someone who has been drinking. When you are tired or distracted. While texting. If you have been using any mind-altering substances or drugs. Wear a helmet and other protective equipment during sports activities. If you have firearms in your house, make sure you follow all gun safety procedures. What's next? Go to your health care provider once a year for an annual wellness visit. Ask your health care provider how often you should  have your eyes and teeth checked. Stay up to date on all vaccines. This information is not intended to replace advice given to you by your health care provider. Make sure you discuss any questions you have with your health care provider. Document Revised: 02/04/2021 Document Reviewed: 02/04/2021 Elsevier Patient Education  [place].   Follow-up plan: Return in about 1 year (around 08/28/2022) for annual physical exam.  Clearnce Sorrel, DNP, APRN, FNP-BC [facility]

## 2021-08-29 ENCOUNTER — Encounter: Payer: Self-pay | Admitting: Medical-Surgical

## 2021-08-29 DIAGNOSIS — R7301 Impaired fasting glucose: Secondary | ICD-10-CM | POA: Insufficient documentation

## 2021-08-29 LAB — CBC WITH DIFFERENTIAL/PLATELET
Absolute Monocytes: 308 cells/uL (ref 200–950)
Basophils Absolute: 59 cells/uL (ref 0–200)
Basophils Relative: 1.5 %
Eosinophils Absolute: 121 cells/uL (ref 15–500)
Eosinophils Relative: 3.1 %
HCT: 46.9 % (ref 38.5–50.0)
Hemoglobin: 15.9 g/dL (ref 13.2–17.1)
Lymphs Abs: 1303 cells/uL (ref 850–3900)
MCH: 29 pg (ref 27.0–33.0)
MCHC: 33.9 g/dL (ref 32.0–36.0)
MCV: 85.6 fL (ref 80.0–100.0)
MPV: 11 fL (ref 7.5–12.5)
Monocytes Relative: 7.9 %
Neutro Abs: 2110 cells/uL (ref 1500–7800)
Neutrophils Relative %: 54.1 %
Platelets: 223 10*3/uL (ref 140–400)
RBC: 5.48 10*6/uL (ref 4.20–5.80)
RDW: 12.2 % (ref 11.0–15.0)
Total Lymphocyte: 33.4 %
WBC: 3.9 10*3/uL (ref 3.8–10.8)

## 2021-08-29 LAB — COMPLETE METABOLIC PANEL WITH GFR
AG Ratio: 2.2 (calc) (ref 1.0–2.5)
ALT: 17 U/L (ref 9–46)
AST: 17 U/L (ref 10–35)
Albumin: 4.3 g/dL (ref 3.6–5.1)
Alkaline phosphatase (APISO): 72 U/L (ref 35–144)
BUN: 18 mg/dL (ref 7–25)
CO2: 30 mmol/L (ref 20–32)
Calcium: 9.5 mg/dL (ref 8.6–10.3)
Chloride: 107 mmol/L (ref 98–110)
Creat: 0.98 mg/dL (ref 0.70–1.30)
Globulin: 2 g/dL (calc) (ref 1.9–3.7)
Glucose, Bld: 108 mg/dL — ABNORMAL HIGH (ref 65–99)
Potassium: 5.2 mmol/L (ref 3.5–5.3)
Sodium: 143 mmol/L (ref 135–146)
Total Bilirubin: 0.4 mg/dL (ref 0.2–1.2)
Total Protein: 6.3 g/dL (ref 6.1–8.1)
eGFR: 93 mL/min/{1.73_m2} (ref >=60)

## 2021-08-29 LAB — LIPID PANEL
Cholesterol: 213 mg/dL — ABNORMAL HIGH (ref <200)
HDL: 58 mg/dL (ref >=40)
LDL Cholesterol (Calc): 140 mg/dL (calc) — ABNORMAL HIGH
Non-HDL Cholesterol (Calc): 155 mg/dL (calc) — ABNORMAL HIGH (ref <130)
Total CHOL/HDL Ratio: 3.7 (calc) (ref <5.0)
Triglycerides: 61 mg/dL (ref <150)

## 2021-08-29 LAB — PSA: PSA: 0.48 ng/mL (ref <=4.00)

## 2021-09-16 DIAGNOSIS — M9904 Segmental and somatic dysfunction of sacral region: Secondary | ICD-10-CM | POA: Diagnosis not present

## 2021-09-16 DIAGNOSIS — M5431 Sciatica, right side: Secondary | ICD-10-CM | POA: Diagnosis not present

## 2021-09-16 DIAGNOSIS — M9905 Segmental and somatic dysfunction of pelvic region: Secondary | ICD-10-CM | POA: Diagnosis not present

## 2021-09-16 DIAGNOSIS — M9903 Segmental and somatic dysfunction of lumbar region: Secondary | ICD-10-CM | POA: Diagnosis not present

## 2021-09-18 ENCOUNTER — Other Ambulatory Visit: Payer: Self-pay | Admitting: Medical-Surgical

## 2021-09-24 DIAGNOSIS — M9904 Segmental and somatic dysfunction of sacral region: Secondary | ICD-10-CM | POA: Diagnosis not present

## 2021-09-24 DIAGNOSIS — M9905 Segmental and somatic dysfunction of pelvic region: Secondary | ICD-10-CM | POA: Diagnosis not present

## 2021-09-24 DIAGNOSIS — M9903 Segmental and somatic dysfunction of lumbar region: Secondary | ICD-10-CM | POA: Diagnosis not present

## 2021-09-24 DIAGNOSIS — M5431 Sciatica, right side: Secondary | ICD-10-CM | POA: Diagnosis not present

## 2021-09-30 DIAGNOSIS — M9904 Segmental and somatic dysfunction of sacral region: Secondary | ICD-10-CM | POA: Diagnosis not present

## 2021-09-30 DIAGNOSIS — M5431 Sciatica, right side: Secondary | ICD-10-CM | POA: Diagnosis not present

## 2021-09-30 DIAGNOSIS — M9905 Segmental and somatic dysfunction of pelvic region: Secondary | ICD-10-CM | POA: Diagnosis not present

## 2021-09-30 DIAGNOSIS — M9903 Segmental and somatic dysfunction of lumbar region: Secondary | ICD-10-CM | POA: Diagnosis not present

## 2021-10-15 DIAGNOSIS — M9903 Segmental and somatic dysfunction of lumbar region: Secondary | ICD-10-CM | POA: Diagnosis not present

## 2021-10-15 DIAGNOSIS — M9904 Segmental and somatic dysfunction of sacral region: Secondary | ICD-10-CM | POA: Diagnosis not present

## 2021-10-15 DIAGNOSIS — M5431 Sciatica, right side: Secondary | ICD-10-CM | POA: Diagnosis not present

## 2021-10-15 DIAGNOSIS — M9905 Segmental and somatic dysfunction of pelvic region: Secondary | ICD-10-CM | POA: Diagnosis not present

## 2021-10-22 DIAGNOSIS — M9904 Segmental and somatic dysfunction of sacral region: Secondary | ICD-10-CM | POA: Diagnosis not present

## 2021-10-22 DIAGNOSIS — M9903 Segmental and somatic dysfunction of lumbar region: Secondary | ICD-10-CM | POA: Diagnosis not present

## 2021-10-22 DIAGNOSIS — M9905 Segmental and somatic dysfunction of pelvic region: Secondary | ICD-10-CM | POA: Diagnosis not present

## 2021-10-22 DIAGNOSIS — M5431 Sciatica, right side: Secondary | ICD-10-CM | POA: Diagnosis not present

## 2021-10-26 DIAGNOSIS — M9905 Segmental and somatic dysfunction of pelvic region: Secondary | ICD-10-CM | POA: Diagnosis not present

## 2021-10-26 DIAGNOSIS — M9903 Segmental and somatic dysfunction of lumbar region: Secondary | ICD-10-CM | POA: Diagnosis not present

## 2021-10-26 DIAGNOSIS — M5431 Sciatica, right side: Secondary | ICD-10-CM | POA: Diagnosis not present

## 2021-10-26 DIAGNOSIS — M9904 Segmental and somatic dysfunction of sacral region: Secondary | ICD-10-CM | POA: Diagnosis not present

## 2021-10-27 ENCOUNTER — Other Ambulatory Visit: Payer: Self-pay | Admitting: Medical-Surgical

## 2021-11-02 DIAGNOSIS — M9905 Segmental and somatic dysfunction of pelvic region: Secondary | ICD-10-CM | POA: Diagnosis not present

## 2021-11-02 DIAGNOSIS — M9903 Segmental and somatic dysfunction of lumbar region: Secondary | ICD-10-CM | POA: Diagnosis not present

## 2021-11-02 DIAGNOSIS — M9904 Segmental and somatic dysfunction of sacral region: Secondary | ICD-10-CM | POA: Diagnosis not present

## 2021-11-02 DIAGNOSIS — M5431 Sciatica, right side: Secondary | ICD-10-CM | POA: Diagnosis not present

## 2021-11-30 DIAGNOSIS — M9905 Segmental and somatic dysfunction of pelvic region: Secondary | ICD-10-CM | POA: Diagnosis not present

## 2021-11-30 DIAGNOSIS — M5431 Sciatica, right side: Secondary | ICD-10-CM | POA: Diagnosis not present

## 2021-11-30 DIAGNOSIS — M9904 Segmental and somatic dysfunction of sacral region: Secondary | ICD-10-CM | POA: Diagnosis not present

## 2021-11-30 DIAGNOSIS — M9903 Segmental and somatic dysfunction of lumbar region: Secondary | ICD-10-CM | POA: Diagnosis not present

## 2021-12-09 DIAGNOSIS — M9903 Segmental and somatic dysfunction of lumbar region: Secondary | ICD-10-CM | POA: Diagnosis not present

## 2021-12-09 DIAGNOSIS — M9905 Segmental and somatic dysfunction of pelvic region: Secondary | ICD-10-CM | POA: Diagnosis not present

## 2021-12-09 DIAGNOSIS — M5431 Sciatica, right side: Secondary | ICD-10-CM | POA: Diagnosis not present

## 2021-12-09 DIAGNOSIS — M9904 Segmental and somatic dysfunction of sacral region: Secondary | ICD-10-CM | POA: Diagnosis not present

## 2021-12-14 DIAGNOSIS — M9903 Segmental and somatic dysfunction of lumbar region: Secondary | ICD-10-CM | POA: Diagnosis not present

## 2021-12-14 DIAGNOSIS — M9905 Segmental and somatic dysfunction of pelvic region: Secondary | ICD-10-CM | POA: Diagnosis not present

## 2021-12-14 DIAGNOSIS — M9904 Segmental and somatic dysfunction of sacral region: Secondary | ICD-10-CM | POA: Diagnosis not present

## 2021-12-14 DIAGNOSIS — M5431 Sciatica, right side: Secondary | ICD-10-CM | POA: Diagnosis not present

## 2021-12-28 DIAGNOSIS — M9904 Segmental and somatic dysfunction of sacral region: Secondary | ICD-10-CM | POA: Diagnosis not present

## 2021-12-28 DIAGNOSIS — M9905 Segmental and somatic dysfunction of pelvic region: Secondary | ICD-10-CM | POA: Diagnosis not present

## 2021-12-28 DIAGNOSIS — M5431 Sciatica, right side: Secondary | ICD-10-CM | POA: Diagnosis not present

## 2021-12-28 DIAGNOSIS — M9903 Segmental and somatic dysfunction of lumbar region: Secondary | ICD-10-CM | POA: Diagnosis not present

## 2021-12-31 DIAGNOSIS — M9905 Segmental and somatic dysfunction of pelvic region: Secondary | ICD-10-CM | POA: Diagnosis not present

## 2021-12-31 DIAGNOSIS — M5431 Sciatica, right side: Secondary | ICD-10-CM | POA: Diagnosis not present

## 2021-12-31 DIAGNOSIS — M9903 Segmental and somatic dysfunction of lumbar region: Secondary | ICD-10-CM | POA: Diagnosis not present

## 2021-12-31 DIAGNOSIS — M9904 Segmental and somatic dysfunction of sacral region: Secondary | ICD-10-CM | POA: Diagnosis not present

## 2022-01-11 DIAGNOSIS — M5431 Sciatica, right side: Secondary | ICD-10-CM | POA: Diagnosis not present

## 2022-01-11 DIAGNOSIS — M9904 Segmental and somatic dysfunction of sacral region: Secondary | ICD-10-CM | POA: Diagnosis not present

## 2022-01-11 DIAGNOSIS — M9905 Segmental and somatic dysfunction of pelvic region: Secondary | ICD-10-CM | POA: Diagnosis not present

## 2022-01-11 DIAGNOSIS — M9903 Segmental and somatic dysfunction of lumbar region: Secondary | ICD-10-CM | POA: Diagnosis not present

## 2022-01-20 DIAGNOSIS — M9904 Segmental and somatic dysfunction of sacral region: Secondary | ICD-10-CM | POA: Diagnosis not present

## 2022-01-20 DIAGNOSIS — M9905 Segmental and somatic dysfunction of pelvic region: Secondary | ICD-10-CM | POA: Diagnosis not present

## 2022-01-20 DIAGNOSIS — M5431 Sciatica, right side: Secondary | ICD-10-CM | POA: Diagnosis not present

## 2022-01-20 DIAGNOSIS — M9903 Segmental and somatic dysfunction of lumbar region: Secondary | ICD-10-CM | POA: Diagnosis not present

## 2022-02-10 DIAGNOSIS — M9905 Segmental and somatic dysfunction of pelvic region: Secondary | ICD-10-CM | POA: Diagnosis not present

## 2022-02-10 DIAGNOSIS — M9904 Segmental and somatic dysfunction of sacral region: Secondary | ICD-10-CM | POA: Diagnosis not present

## 2022-02-10 DIAGNOSIS — M5431 Sciatica, right side: Secondary | ICD-10-CM | POA: Diagnosis not present

## 2022-02-10 DIAGNOSIS — M9903 Segmental and somatic dysfunction of lumbar region: Secondary | ICD-10-CM | POA: Diagnosis not present

## 2022-02-18 DIAGNOSIS — M5431 Sciatica, right side: Secondary | ICD-10-CM | POA: Diagnosis not present

## 2022-02-18 DIAGNOSIS — M9903 Segmental and somatic dysfunction of lumbar region: Secondary | ICD-10-CM | POA: Diagnosis not present

## 2022-02-18 DIAGNOSIS — M9905 Segmental and somatic dysfunction of pelvic region: Secondary | ICD-10-CM | POA: Diagnosis not present

## 2022-02-18 DIAGNOSIS — M9904 Segmental and somatic dysfunction of sacral region: Secondary | ICD-10-CM | POA: Diagnosis not present

## 2022-03-05 ENCOUNTER — Other Ambulatory Visit: Payer: Self-pay | Admitting: Medical-Surgical

## 2022-03-05 DIAGNOSIS — Z1211 Encounter for screening for malignant neoplasm of colon: Secondary | ICD-10-CM

## 2022-03-11 DIAGNOSIS — M9904 Segmental and somatic dysfunction of sacral region: Secondary | ICD-10-CM | POA: Diagnosis not present

## 2022-03-11 DIAGNOSIS — M9903 Segmental and somatic dysfunction of lumbar region: Secondary | ICD-10-CM | POA: Diagnosis not present

## 2022-03-11 DIAGNOSIS — M5431 Sciatica, right side: Secondary | ICD-10-CM | POA: Diagnosis not present

## 2022-03-11 DIAGNOSIS — M9905 Segmental and somatic dysfunction of pelvic region: Secondary | ICD-10-CM | POA: Diagnosis not present

## 2022-03-15 DIAGNOSIS — M9905 Segmental and somatic dysfunction of pelvic region: Secondary | ICD-10-CM | POA: Diagnosis not present

## 2022-03-15 DIAGNOSIS — M5431 Sciatica, right side: Secondary | ICD-10-CM | POA: Diagnosis not present

## 2022-03-15 DIAGNOSIS — M9904 Segmental and somatic dysfunction of sacral region: Secondary | ICD-10-CM | POA: Diagnosis not present

## 2022-03-15 DIAGNOSIS — M9903 Segmental and somatic dysfunction of lumbar region: Secondary | ICD-10-CM | POA: Diagnosis not present

## 2022-03-22 DIAGNOSIS — M5431 Sciatica, right side: Secondary | ICD-10-CM | POA: Diagnosis not present

## 2022-03-22 DIAGNOSIS — M9904 Segmental and somatic dysfunction of sacral region: Secondary | ICD-10-CM | POA: Diagnosis not present

## 2022-03-22 DIAGNOSIS — M9903 Segmental and somatic dysfunction of lumbar region: Secondary | ICD-10-CM | POA: Diagnosis not present

## 2022-03-22 DIAGNOSIS — M9905 Segmental and somatic dysfunction of pelvic region: Secondary | ICD-10-CM | POA: Diagnosis not present

## 2022-04-08 DIAGNOSIS — M9903 Segmental and somatic dysfunction of lumbar region: Secondary | ICD-10-CM | POA: Diagnosis not present

## 2022-04-08 DIAGNOSIS — M5431 Sciatica, right side: Secondary | ICD-10-CM | POA: Diagnosis not present

## 2022-04-08 DIAGNOSIS — M9904 Segmental and somatic dysfunction of sacral region: Secondary | ICD-10-CM | POA: Diagnosis not present

## 2022-04-08 DIAGNOSIS — M9905 Segmental and somatic dysfunction of pelvic region: Secondary | ICD-10-CM | POA: Diagnosis not present

## 2022-04-19 ENCOUNTER — Other Ambulatory Visit: Payer: Self-pay | Admitting: Medical-Surgical

## 2022-04-21 DIAGNOSIS — M9904 Segmental and somatic dysfunction of sacral region: Secondary | ICD-10-CM | POA: Diagnosis not present

## 2022-04-21 DIAGNOSIS — M5431 Sciatica, right side: Secondary | ICD-10-CM | POA: Diagnosis not present

## 2022-04-21 DIAGNOSIS — M9903 Segmental and somatic dysfunction of lumbar region: Secondary | ICD-10-CM | POA: Diagnosis not present

## 2022-04-21 DIAGNOSIS — M9905 Segmental and somatic dysfunction of pelvic region: Secondary | ICD-10-CM | POA: Diagnosis not present

## 2022-04-23 NOTE — Telephone Encounter (Signed)
  Last office visit 08/28/2021  No future appointment.  Last filled 10/29/2021

## 2022-04-29 DIAGNOSIS — M9903 Segmental and somatic dysfunction of lumbar region: Secondary | ICD-10-CM | POA: Diagnosis not present

## 2022-04-29 DIAGNOSIS — M9904 Segmental and somatic dysfunction of sacral region: Secondary | ICD-10-CM | POA: Diagnosis not present

## 2022-04-29 DIAGNOSIS — M5431 Sciatica, right side: Secondary | ICD-10-CM | POA: Diagnosis not present

## 2022-04-29 DIAGNOSIS — M9905 Segmental and somatic dysfunction of pelvic region: Secondary | ICD-10-CM | POA: Diagnosis not present

## 2022-05-12 DIAGNOSIS — M9903 Segmental and somatic dysfunction of lumbar region: Secondary | ICD-10-CM | POA: Diagnosis not present

## 2022-05-12 DIAGNOSIS — M5431 Sciatica, right side: Secondary | ICD-10-CM | POA: Diagnosis not present

## 2022-05-12 DIAGNOSIS — M9905 Segmental and somatic dysfunction of pelvic region: Secondary | ICD-10-CM | POA: Diagnosis not present

## 2022-05-12 DIAGNOSIS — M9904 Segmental and somatic dysfunction of sacral region: Secondary | ICD-10-CM | POA: Diagnosis not present

## 2022-06-16 DIAGNOSIS — M9904 Segmental and somatic dysfunction of sacral region: Secondary | ICD-10-CM | POA: Diagnosis not present

## 2022-06-16 DIAGNOSIS — M5431 Sciatica, right side: Secondary | ICD-10-CM | POA: Diagnosis not present

## 2022-06-16 DIAGNOSIS — M9905 Segmental and somatic dysfunction of pelvic region: Secondary | ICD-10-CM | POA: Diagnosis not present

## 2022-06-16 DIAGNOSIS — M9903 Segmental and somatic dysfunction of lumbar region: Secondary | ICD-10-CM | POA: Diagnosis not present

## 2022-07-01 DIAGNOSIS — M9905 Segmental and somatic dysfunction of pelvic region: Secondary | ICD-10-CM | POA: Diagnosis not present

## 2022-07-01 DIAGNOSIS — M5431 Sciatica, right side: Secondary | ICD-10-CM | POA: Diagnosis not present

## 2022-07-01 DIAGNOSIS — M9904 Segmental and somatic dysfunction of sacral region: Secondary | ICD-10-CM | POA: Diagnosis not present

## 2022-07-01 DIAGNOSIS — M9903 Segmental and somatic dysfunction of lumbar region: Secondary | ICD-10-CM | POA: Diagnosis not present

## 2022-07-13 DIAGNOSIS — M9903 Segmental and somatic dysfunction of lumbar region: Secondary | ICD-10-CM | POA: Diagnosis not present

## 2022-07-13 DIAGNOSIS — M5431 Sciatica, right side: Secondary | ICD-10-CM | POA: Diagnosis not present

## 2022-07-13 DIAGNOSIS — M9905 Segmental and somatic dysfunction of pelvic region: Secondary | ICD-10-CM | POA: Diagnosis not present

## 2022-07-13 DIAGNOSIS — M9904 Segmental and somatic dysfunction of sacral region: Secondary | ICD-10-CM | POA: Diagnosis not present

## 2022-07-19 ENCOUNTER — Other Ambulatory Visit: Payer: Self-pay | Admitting: Medical-Surgical

## 2022-07-27 ENCOUNTER — Other Ambulatory Visit: Payer: Self-pay | Admitting: Medical-Surgical

## 2022-07-29 ENCOUNTER — Other Ambulatory Visit: Payer: Self-pay | Admitting: Medical-Surgical

## 2022-08-02 DIAGNOSIS — M9903 Segmental and somatic dysfunction of lumbar region: Secondary | ICD-10-CM | POA: Diagnosis not present

## 2022-08-02 DIAGNOSIS — M9904 Segmental and somatic dysfunction of sacral region: Secondary | ICD-10-CM | POA: Diagnosis not present

## 2022-08-02 DIAGNOSIS — M5431 Sciatica, right side: Secondary | ICD-10-CM | POA: Diagnosis not present

## 2022-08-02 DIAGNOSIS — M9905 Segmental and somatic dysfunction of pelvic region: Secondary | ICD-10-CM | POA: Diagnosis not present

## 2022-08-23 ENCOUNTER — Other Ambulatory Visit: Payer: Self-pay | Admitting: Medical-Surgical

## 2022-08-24 NOTE — Telephone Encounter (Signed)
Has an appointment 08/27/2022

## 2022-08-24 NOTE — Telephone Encounter (Signed)
Needs updated lab work for further refills to ensure kidney function is good.

## 2022-08-25 DIAGNOSIS — M9903 Segmental and somatic dysfunction of lumbar region: Secondary | ICD-10-CM | POA: Diagnosis not present

## 2022-08-25 DIAGNOSIS — M9904 Segmental and somatic dysfunction of sacral region: Secondary | ICD-10-CM | POA: Diagnosis not present

## 2022-08-25 DIAGNOSIS — M5431 Sciatica, right side: Secondary | ICD-10-CM | POA: Diagnosis not present

## 2022-08-25 DIAGNOSIS — M9905 Segmental and somatic dysfunction of pelvic region: Secondary | ICD-10-CM | POA: Diagnosis not present

## 2022-08-25 NOTE — Telephone Encounter (Signed)
Pt called and made aware that he will need to have labs done at his appt on 1/5 to continue to get his medications refilled.

## 2022-08-27 ENCOUNTER — Encounter: Payer: Self-pay | Admitting: Medical-Surgical

## 2022-08-27 ENCOUNTER — Ambulatory Visit (INDEPENDENT_AMBULATORY_CARE_PROVIDER_SITE_OTHER): Payer: BC Managed Care – PPO | Admitting: Medical-Surgical

## 2022-08-27 VITALS — BP 126/71 | HR 61 | Resp 20 | Ht 71.5 in | Wt 226.2 lb

## 2022-08-27 DIAGNOSIS — Z13220 Encounter for screening for lipoid disorders: Secondary | ICD-10-CM

## 2022-08-27 DIAGNOSIS — Z1211 Encounter for screening for malignant neoplasm of colon: Secondary | ICD-10-CM | POA: Diagnosis not present

## 2022-08-27 DIAGNOSIS — R7401 Elevation of levels of liver transaminase levels: Secondary | ICD-10-CM | POA: Diagnosis not present

## 2022-08-27 DIAGNOSIS — Z0000 Encounter for general adult medical examination without abnormal findings: Secondary | ICD-10-CM

## 2022-08-27 DIAGNOSIS — R7301 Impaired fasting glucose: Secondary | ICD-10-CM | POA: Diagnosis not present

## 2022-08-27 DIAGNOSIS — R202 Paresthesia of skin: Secondary | ICD-10-CM

## 2022-08-27 DIAGNOSIS — R21 Rash and other nonspecific skin eruption: Secondary | ICD-10-CM

## 2022-08-27 MED ORDER — CLOBETASOL PROPIONATE 0.05 % EX CREA: 1.0000 | TOPICAL_CREAM | Freq: Two times a day (BID) | CUTANEOUS | 0 refills | Status: DC

## 2022-08-27 NOTE — Progress Notes (Signed)
Established Patient Office Visit  Subjective   Patient ID: Aasir Daigler, male   DOB: Jan 27, 1971 Age: 52 y.o. MRN: [id number]   Chief Complaint  Patient presents with   leg tingles   HPI Pleasant 52 year old male accompanied by his wife presenting today to discuss right lower extremity tingling.  Notes that the symptoms have been going on for about a year.  The tingling starts about mid lateral calf and goes down over his lateral ankle into the top of his foot and affects the second, third, and fourth toe.  That foot also stays cold all the time.  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  There is no pain involved and he has not noticed any color changes.  Was seeing a chiropractor for this but the symptoms have not resolved this whole time.  Has a history of pelvic fracture and lumbar spinal arthritis.  The chiropractor told him he felt it was more of a nerve impingement issue.  Taking meloxicam 15 mg daily which helps with arthritis symptoms but has not made any difference to the tingling.  Able to perform all daily activities independently without impairment.  Has a family history of dermatitis that affects the hands and feet.  Multiple family members are affected by this and he is no exception.  Notes that only his right hand is affected but the soles of both feet are as well.  The skin gets very dry, flaky, and begins to crack.  Interested in something that may help resolve this.   Objective:    Vitals:   08/27/22 1124  BP: 126/71  Pulse: 61  Resp: 20  Height: 5' 11.5" (1.816 m)  Weight: 226 lb 3.2 oz (102.6 kg)  SpO2: 97%  BMI (Calculated): 31.11    Physical Exam Vitals reviewed.  Constitutional:      General: He is not in acute distress.    Appearance: Normal appearance. He  is not ill-appearing.  HENT:     Head: Normocephalic.  Cardiovascular:     Rate and Rhythm: Normal rate.     Pulses:          Dorsalis pedis pulses are 1+ on the left side.     Heart sounds: Normal heart sounds. No murmur heard.    No friction rub. No gallop.  Pulmonary:     Effort: Pulmonary effort is normal. No respiratory distress.     Breath sounds: Normal breath sounds.  Musculoskeletal:     Right lower leg: No edema.     Left lower leg: No edema.     Right foot: Normal range of motion.     Left foot: Normal range of motion.  Feet:     Left foot:     Skin integrity: Skin integrity normal.     Comments: Temperature of skin of right foot and middle 3 toes cooler to touch than surrounding areas.  Cap refill less than 3 seconds to all toes. Skin:    General: Skin is warm and dry.  Neurological:     Mental Status: He is alert and oriented to person, place, and time.  Psychiatric:        Mood and Affect: Mood normal.  Behavior: Behavior normal.        Thought Content: Thought content normal.        Judgment: Judgment normal.   No results found for this or any previous visit (from the past 24 hour(s)).     The 10-year ASCVD risk score (Arnett DK, et al., 2019) is: 3.9%   Values used to calculate the score:     Age: 49 years     Sex: Male     Is Non-Hispanic African American: No     Diabetic: No     Tobacco smoker: No     Systolic Blood Pressure: 476 mmHg     Is BP treated: No     HDL Cholesterol: 58 mg/dL     Total Cholesterol: 213 mg/dL   Assessment & Plan:   1. Colon cancer screening Referring to GI for colonoscopy. - Ambulatory referral to Gastroenterology  2. Impaired fasting glucose Checking hemoglobin A1c. - Hemoglobin A1c  3. Rash Trialing clobetasol cream twice daily for up to 2 weeks to the right hand and soles of both feet. - clobetasol cream (TEMOVATE) 0.05 %; Apply 1 Application topically 2 (two) times daily.  Dispense: 60 g; Refill: 0  4.  Right leg paresthesias Checking CMP.  With temperature changes, getting ABIs for further investigation. - COMPLETE METABOLIC PANEL WITH GFR - VAS [place] ABI WITH/WO TBI; Future  5. Transaminitis Checking CMP. - COMPLETE METABOLIC PANEL WITH GFR  6. Lipid screening Checking lipids. - Lipid panel  7. Preventative health care Checking CBC with differential. - CBC with Differential/Platelet   Return if symptoms worsen or fail to improve.  ___________________________________________ Clearnce Sorrel, DNP, APRN, FNP-BC [facility]

## 2022-08-28 LAB — LIPID PANEL
Cholesterol: 223 mg/dL — ABNORMAL HIGH (ref <200)
HDL: 63 mg/dL (ref >=40)
LDL Cholesterol (Calc): 143 mg/dL (calc) — ABNORMAL HIGH
Non-HDL Cholesterol (Calc): 160 mg/dL (calc) — ABNORMAL HIGH (ref <130)
Total CHOL/HDL Ratio: 3.5 (calc) (ref <5.0)
Triglycerides: 72 mg/dL (ref <150)

## 2022-08-28 LAB — COMPLETE METABOLIC PANEL WITH GFR
AG Ratio: 1.9 (calc) (ref 1.0–2.5)
ALT: 16 U/L (ref 9–46)
AST: 20 U/L (ref 10–35)
Albumin: 4.6 g/dL (ref 3.6–5.1)
Alkaline phosphatase (APISO): 51 U/L (ref 35–144)
BUN: 15 mg/dL (ref 7–25)
CO2: 30 mmol/L (ref 20–32)
Calcium: 9.9 mg/dL (ref 8.6–10.3)
Chloride: 103 mmol/L (ref 98–110)
Creat: 0.98 mg/dL (ref 0.70–1.30)
Globulin: 2.4 g/dL (calc) (ref 1.9–3.7)
Glucose, Bld: 90 mg/dL (ref 65–99)
Potassium: 5.1 mmol/L (ref 3.5–5.3)
Sodium: 141 mmol/L (ref 135–146)
Total Bilirubin: 0.8 mg/dL (ref 0.2–1.2)
Total Protein: 7 g/dL (ref 6.1–8.1)
eGFR: 93 mL/min/{1.73_m2} (ref >=60)

## 2022-08-28 LAB — CBC WITH DIFFERENTIAL/PLATELET
Absolute Monocytes: 431 cells/uL (ref 200–950)
Basophils Absolute: 69 cells/uL (ref 0–200)
Basophils Relative: 1.4 %
Eosinophils Absolute: 59 cells/uL (ref 15–500)
Eosinophils Relative: 1.2 %
HCT: 44.6 % (ref 38.5–50.0)
Hemoglobin: 15.3 g/dL (ref 13.2–17.1)
Lymphs Abs: 1465 cells/uL (ref 850–3900)
MCH: 29.3 pg (ref 27.0–33.0)
MCHC: 34.3 g/dL (ref 32.0–36.0)
MCV: 85.3 fL (ref 80.0–100.0)
MPV: 11.5 fL (ref 7.5–12.5)
Monocytes Relative: 8.8 %
Neutro Abs: 2876 cells/uL (ref 1500–7800)
Neutrophils Relative %: 58.7 %
Platelets: 233 10*3/uL (ref 140–400)
RBC: 5.23 10*6/uL (ref 4.20–5.80)
RDW: 12.6 % (ref 11.0–15.0)
Total Lymphocyte: 29.9 %
WBC: 4.9 10*3/uL (ref 3.8–10.8)

## 2022-08-28 LAB — HEMOGLOBIN A1C
Hgb A1c MFr Bld: 5.8 % of total Hgb — ABNORMAL HIGH (ref <5.7)
Mean Plasma Glucose: 120 mg/dL
eAG (mmol/L): 6.6 mmol/L

## 2022-09-02 ENCOUNTER — Other Ambulatory Visit: Payer: Self-pay | Admitting: Medical-Surgical

## 2022-09-02 ENCOUNTER — Encounter: Payer: Self-pay | Admitting: Medical-Surgical

## 2022-09-02 DIAGNOSIS — R21 Rash and other nonspecific skin eruption: Secondary | ICD-10-CM

## 2022-09-02 MED ORDER — MELOXICAM 15 MG PO TABS: 15.0000 mg | ORAL_TABLET | Freq: Every day | ORAL | 1 refills | Status: DC

## 2022-09-02 MED ORDER — ALBUTEROL SULFATE HFA 108 (90 BASE) MCG/ACT IN AERS: 2.0000 | INHALATION_SPRAY | Freq: Four times a day (QID) | RESPIRATORY_TRACT | 2 refills | Status: DC | PRN

## 2022-09-06 DIAGNOSIS — M9904 Segmental and somatic dysfunction of sacral region: Secondary | ICD-10-CM | POA: Diagnosis not present

## 2022-09-06 DIAGNOSIS — M5431 Sciatica, right side: Secondary | ICD-10-CM | POA: Diagnosis not present

## 2022-09-06 DIAGNOSIS — M9903 Segmental and somatic dysfunction of lumbar region: Secondary | ICD-10-CM | POA: Diagnosis not present

## 2022-09-06 DIAGNOSIS — M9905 Segmental and somatic dysfunction of pelvic region: Secondary | ICD-10-CM | POA: Diagnosis not present

## 2022-09-08 ENCOUNTER — Encounter: Payer: Self-pay | Admitting: Gastroenterology

## 2022-09-13 ENCOUNTER — Ambulatory Visit (HOSPITAL_COMMUNITY): Payer: BC Managed Care – PPO

## 2022-09-15 DIAGNOSIS — M9904 Segmental and somatic dysfunction of sacral region: Secondary | ICD-10-CM | POA: Diagnosis not present

## 2022-09-15 DIAGNOSIS — M5431 Sciatica, right side: Secondary | ICD-10-CM | POA: Diagnosis not present

## 2022-09-15 DIAGNOSIS — M9903 Segmental and somatic dysfunction of lumbar region: Secondary | ICD-10-CM | POA: Diagnosis not present

## 2022-09-15 DIAGNOSIS — M9905 Segmental and somatic dysfunction of pelvic region: Secondary | ICD-10-CM | POA: Diagnosis not present

## 2022-09-27 ENCOUNTER — Ambulatory Visit: Payer: BC Managed Care – PPO | Admitting: *Deleted

## 2022-09-27 VITALS — Ht 72.0 in | Wt 215.0 lb

## 2022-09-27 DIAGNOSIS — M9904 Segmental and somatic dysfunction of sacral region: Secondary | ICD-10-CM | POA: Diagnosis not present

## 2022-09-27 DIAGNOSIS — M9903 Segmental and somatic dysfunction of lumbar region: Secondary | ICD-10-CM | POA: Diagnosis not present

## 2022-09-27 DIAGNOSIS — M5431 Sciatica, right side: Secondary | ICD-10-CM | POA: Diagnosis not present

## 2022-09-27 DIAGNOSIS — Z1211 Encounter for screening for malignant neoplasm of colon: Secondary | ICD-10-CM

## 2022-09-27 DIAGNOSIS — M9905 Segmental and somatic dysfunction of pelvic region: Secondary | ICD-10-CM | POA: Diagnosis not present

## 2022-09-27 IMAGING — CT CT CHEST W/ CM
2 of 3 series · 15 of 36 positions shown, 18 images · IV contrast (Omnipaque or Isovue)
Comparison: Chest x-ray earlier today.

CLINICAL DATA: Cough, wheezing, left-sided back pain and pneumonia.

EXAM:
CT CHEST WITH CONTRAST
TECHNIQUE: Multidetector CT imaging of the chest was performed during
intravenous contrast administration.
CONTRAST:  75mL OMNIPAQUE IOHEXOL 300 MG/ML  SOLN

[Series 2: routine chest with · axial · 0.79mm/px · z∈[+1055,+1319]mm · 12 of 156 slices shown, 15 images]
[im 12/156  mediastinal]
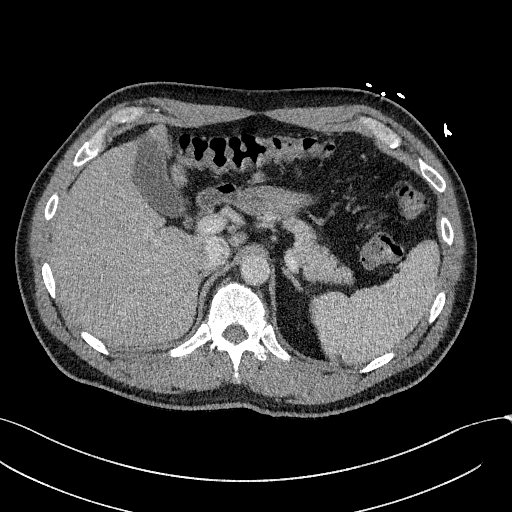
[im 12/156  lung]
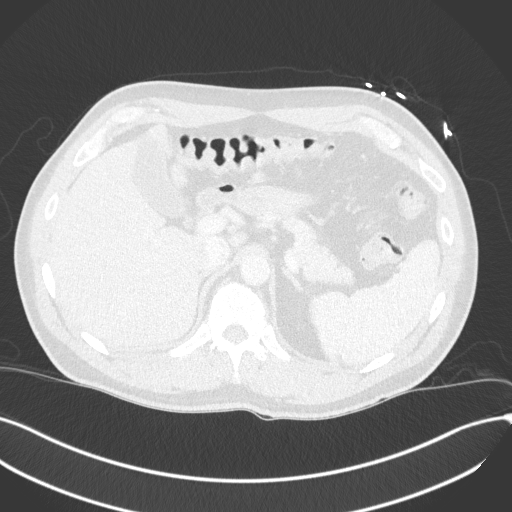
[im 23/156  lung]
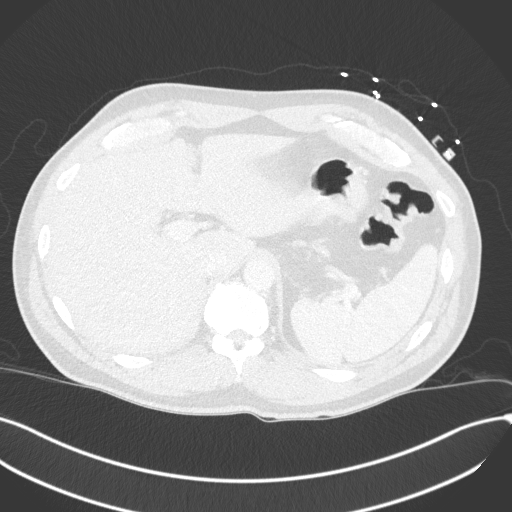
[im 35/156  lung]
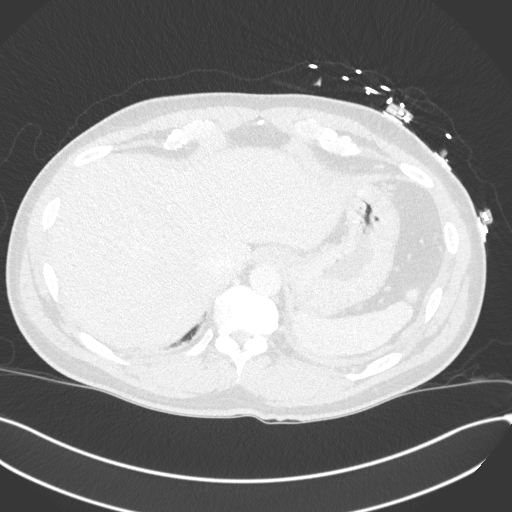
[im 46/156  lung]
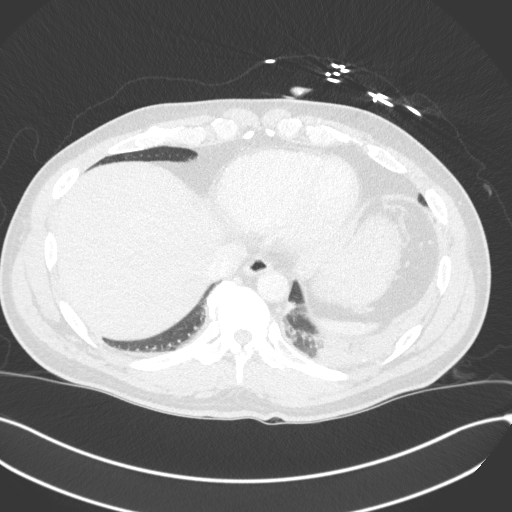
[im 58/156  mediastinal]
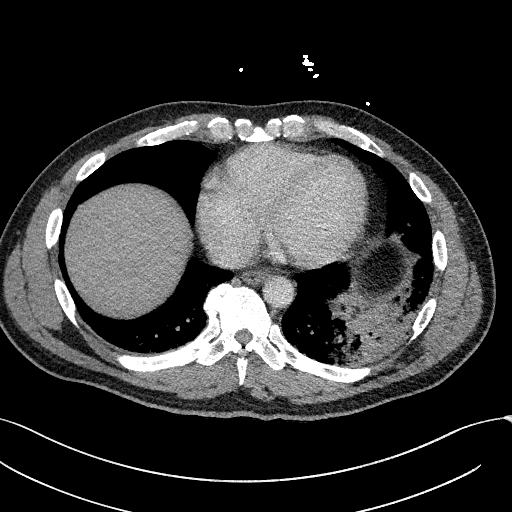
[im 58/156  lung]
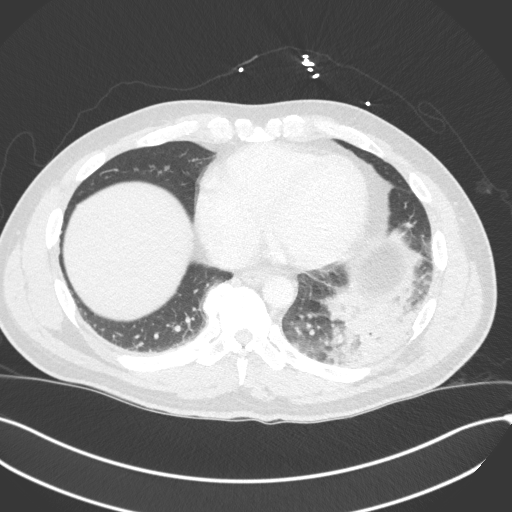
[im 69/156  lung]
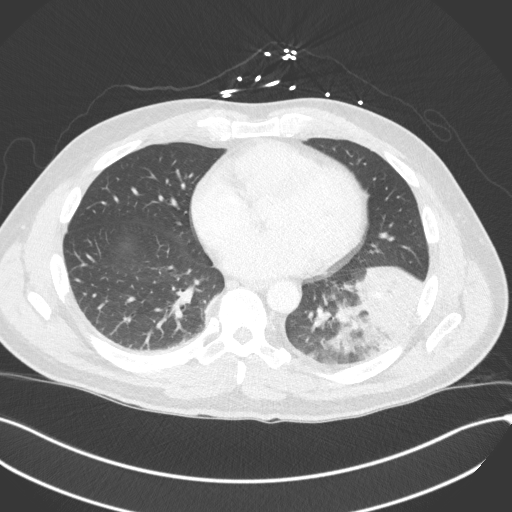
[im 87/156  lung]
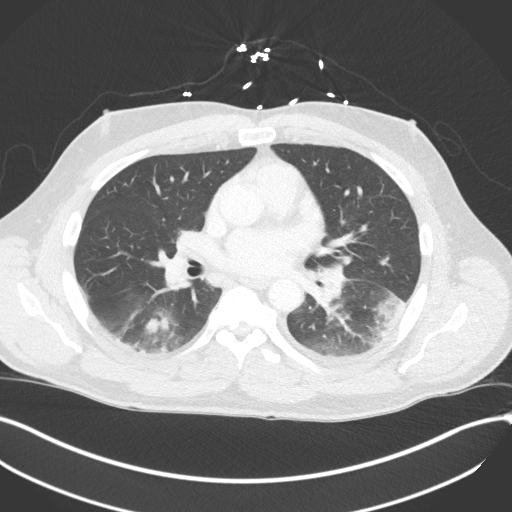
[im 98/156  lung]
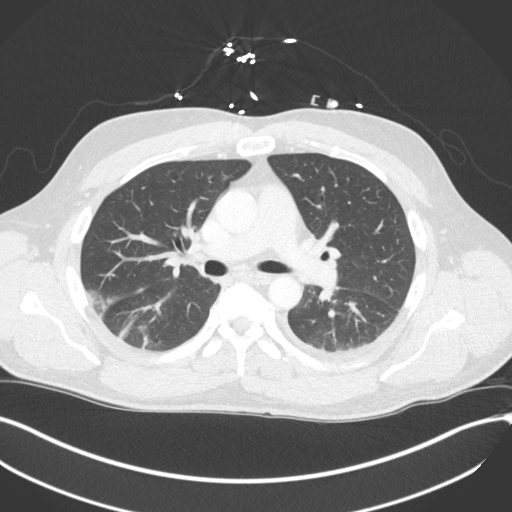
[im 110/156  mediastinal]
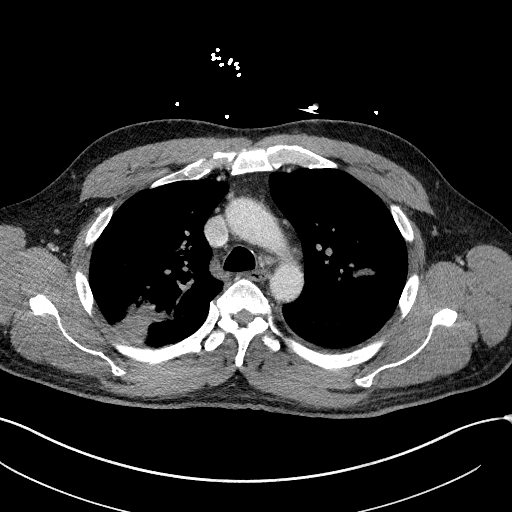
[im 110/156  lung]
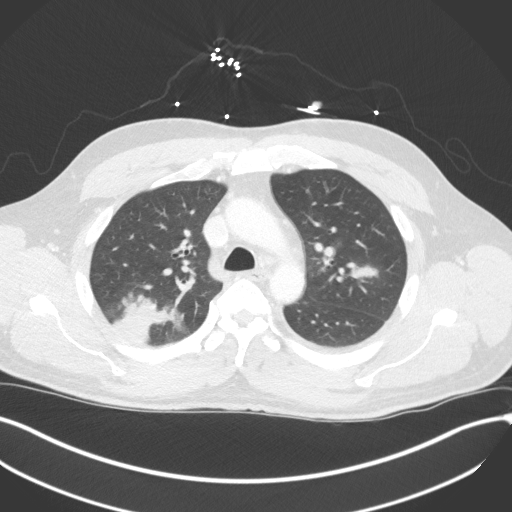
[im 121/156  lung]
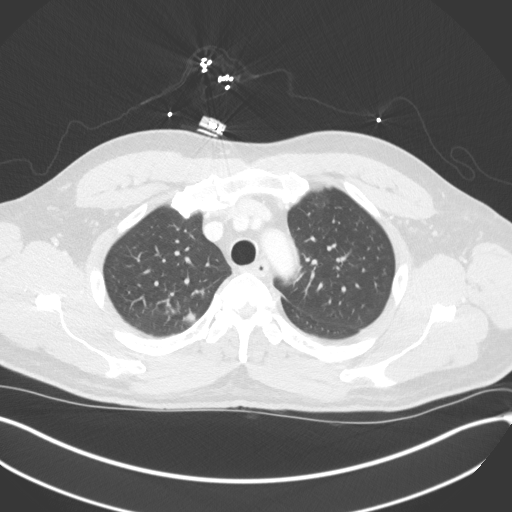
[im 133/156  lung]
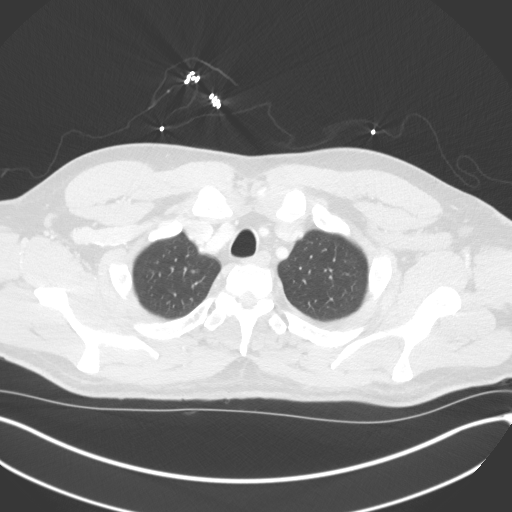
[im 144/156  lung]
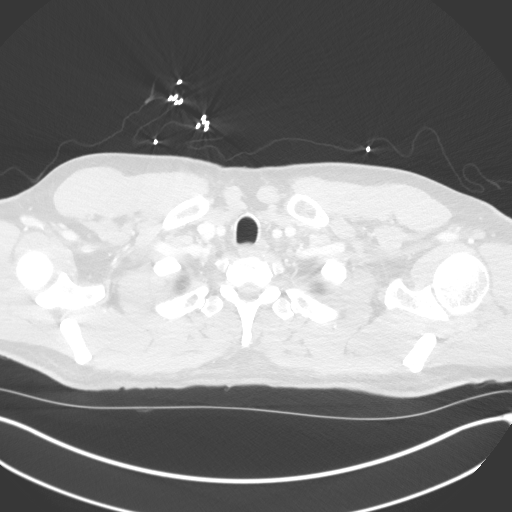

[Series 5: coronal · coronal · 0.64mm/px · 3 of 134 slices shown]
[im 27/134  lung]
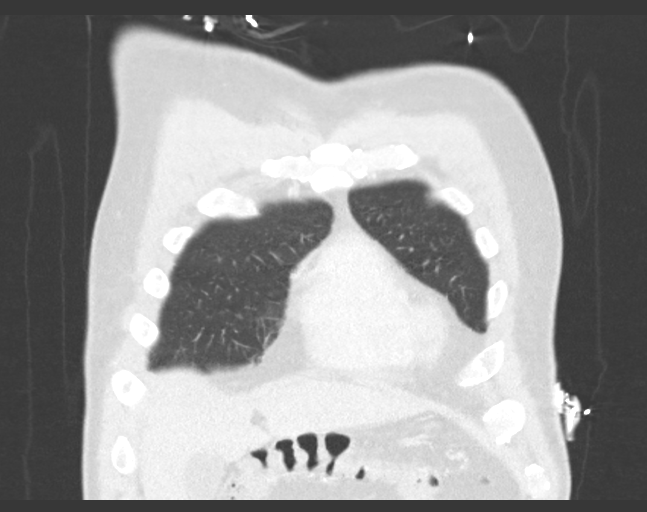
[im 54/134  lung]
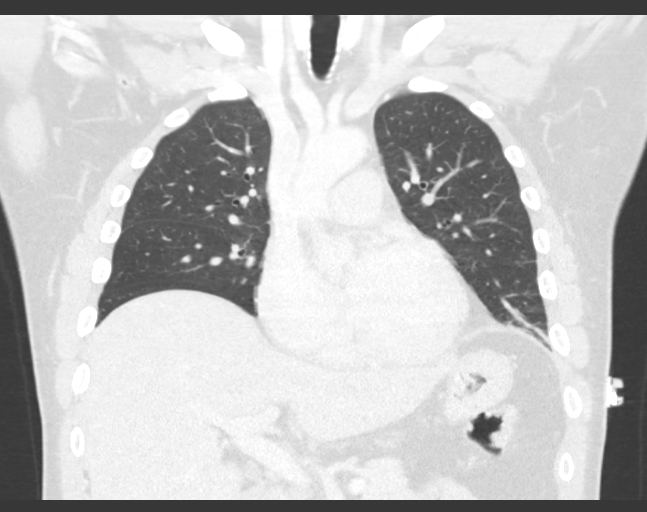
[im 80/134  lung]
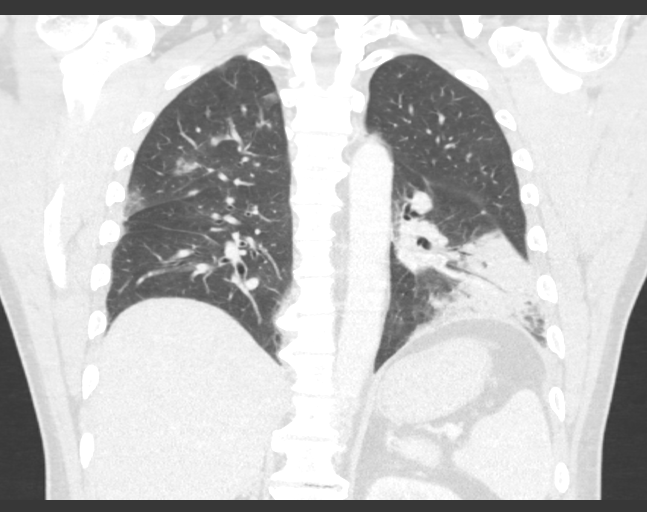

[15 of 36 positions shown; findings below may reference images not displayed]

FINDINGS: Cardiovascular: The heart size is normal. No pericardial fluid
identified. There may be a mild amount of calcified plaque in the
distribution of the LAD. Central pulmonary arteries are normal in
caliber. The thoracic aorta is normal in caliber.

Mediastinum/Nodes: No enlarged mediastinal, hilar, or axillary lymph
nodes. Thyroid gland, trachea, and esophagus demonstrate no
significant findings.

Lungs/Pleura: Multifocal bilateral consolidative pneumonia
identified with the most significant involvement within much of the
left lower lobe. There also are areas of consolidation in the
posterior right upper lobe, superior segment of the right lower lobe
and central left upper lobe. No associated pleural effusions or
pneumothorax.

Upper Abdomen: No acute abnormality.

Musculoskeletal: No chest wall abnormality. No acute or significant
osseous findings.
IMPRESSION: 1. Multifocal bilateral consolidative pneumonia with the most
significant involvement within the left lower lobe. Pneumonia also
noted in both upper lobes and the superior segment of the right
lower lobe.
2. Possible mild amount of calcified plaque in the distribution of
the LAD.

## 2022-09-27 IMAGING — DX DG CHEST 1V PORT
1 series · 1 of 1 positions shown · non-contrast
Comparison: None.

CLINICAL DATA: Short of breath.  Cough for several days.

EXAM:
PORTABLE CHEST 1 VIEW

[chest ap]
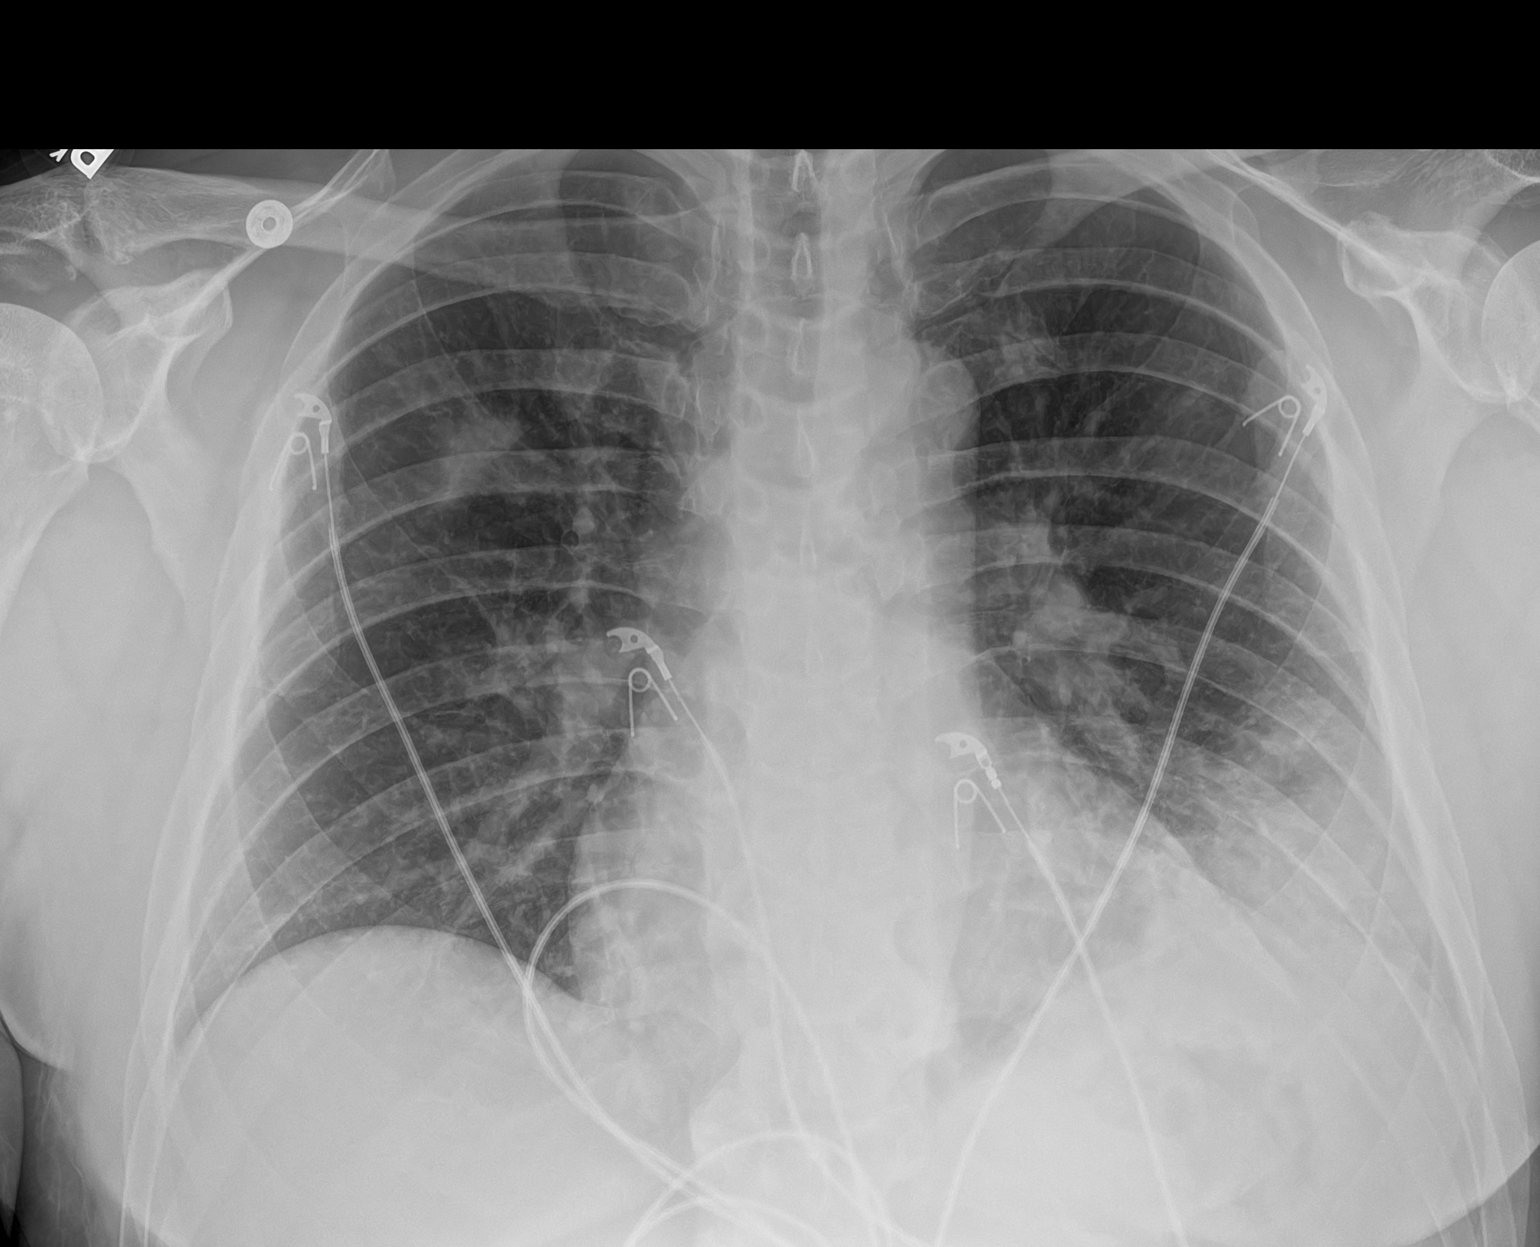

[1 of 1 positions shown; findings below may reference images not displayed]

FINDINGS: Cardiac silhouette is normal in size. Normal mediastinal and hilar
contours.

There is consolidation at the left lung base partly silhouetting the
left hemidiaphragm. There is a small nodular area of opacity,
spanning 2.6 cm, in the right upper lobe. Remainder of the lungs is
clear.

No convincing pleural effusion.  No pneumothorax.

Skeletal structures are grossly intact.
IMPRESSION: 1. Left lung base consolidation consistent with pneumonia. Nodular
area of opacity in the right upper lobe is likely a secondary focus
of infection. Recommend follow-up chest radiographs after treatment
to document improvement/clearing.

## 2022-09-27 MED ORDER — NA SULFATE-K SULFATE-MG SULF 17.5-3.13-1.6 GM/177ML PO SOLN: 1.0000 | Freq: Once | ORAL | 0 refills | Status: AC

## 2022-09-27 NOTE — Progress Notes (Signed)

## 2022-09-30 DIAGNOSIS — M9903 Segmental and somatic dysfunction of lumbar region: Secondary | ICD-10-CM | POA: Diagnosis not present

## 2022-09-30 DIAGNOSIS — M9905 Segmental and somatic dysfunction of pelvic region: Secondary | ICD-10-CM | POA: Diagnosis not present

## 2022-09-30 DIAGNOSIS — M9904 Segmental and somatic dysfunction of sacral region: Secondary | ICD-10-CM | POA: Diagnosis not present

## 2022-09-30 DIAGNOSIS — M5431 Sciatica, right side: Secondary | ICD-10-CM | POA: Diagnosis not present

## 2022-10-04 ENCOUNTER — Ambulatory Visit (HOSPITAL_COMMUNITY)
Admission: RE | Admit: 2022-10-04 | Discharge: 2022-10-04 | Disposition: A | Discharge status: Home / Self Care | Payer: BC Managed Care – PPO | Source: Ambulatory Visit | Attending: Medical-Surgical | Admitting: Medical-Surgical

## 2022-10-04 DIAGNOSIS — R202 Paresthesia of skin: Secondary | ICD-10-CM | POA: Diagnosis not present

## 2022-10-05 LAB — VAS US ABI WITH/WO TBI
Left ABI: 1.34
Right ABI: 1.35

## 2022-10-12 ENCOUNTER — Encounter: Payer: Self-pay | Admitting: Gastroenterology

## 2022-10-16 IMAGING — DX DG CHEST 2V
2 series · 2 of 2 positions shown · non-contrast
Comparison: 07/26/2020

CLINICAL DATA: Follow-up pneumonia.

EXAM:
CHEST - 2 VIEW

[chest pa]
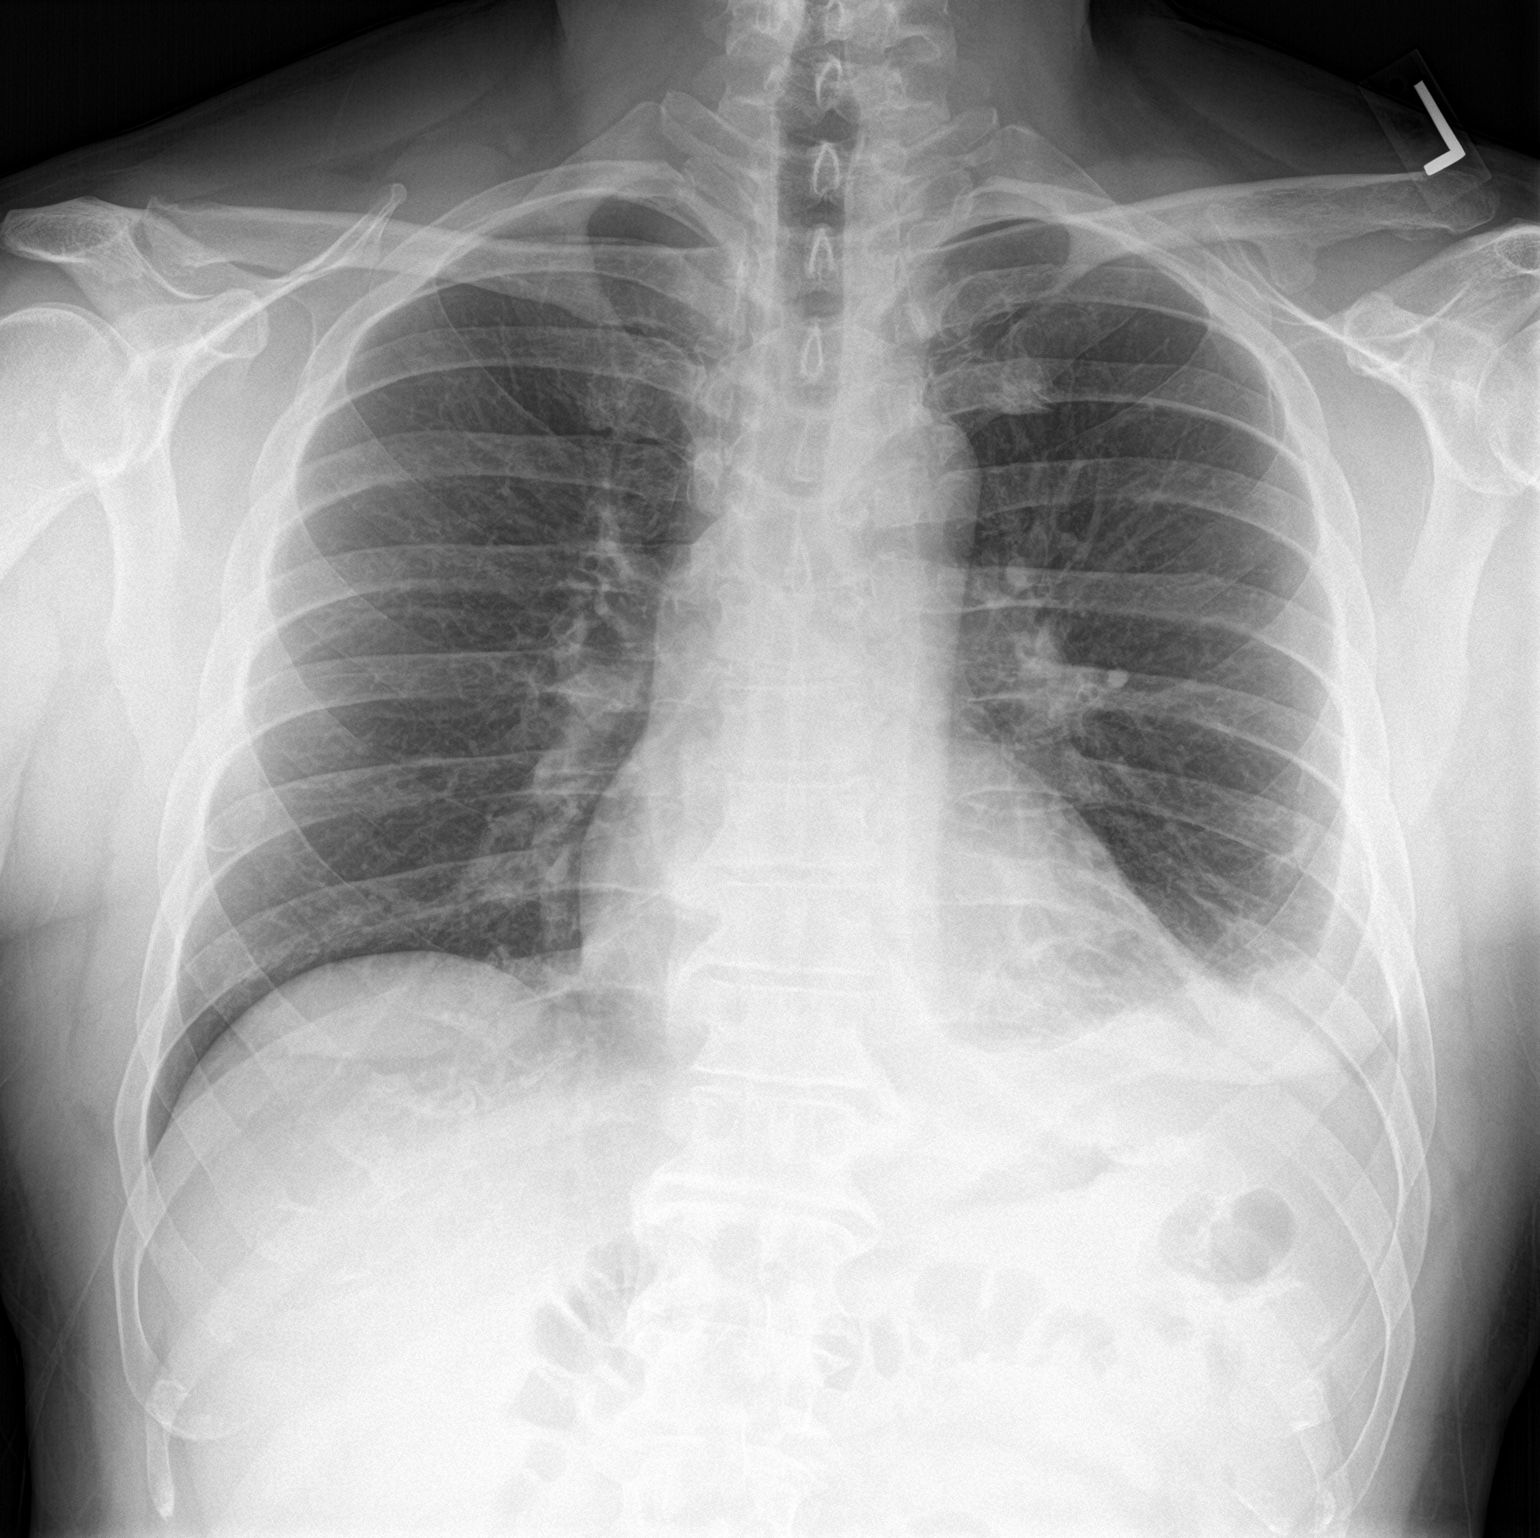

[chest lat]
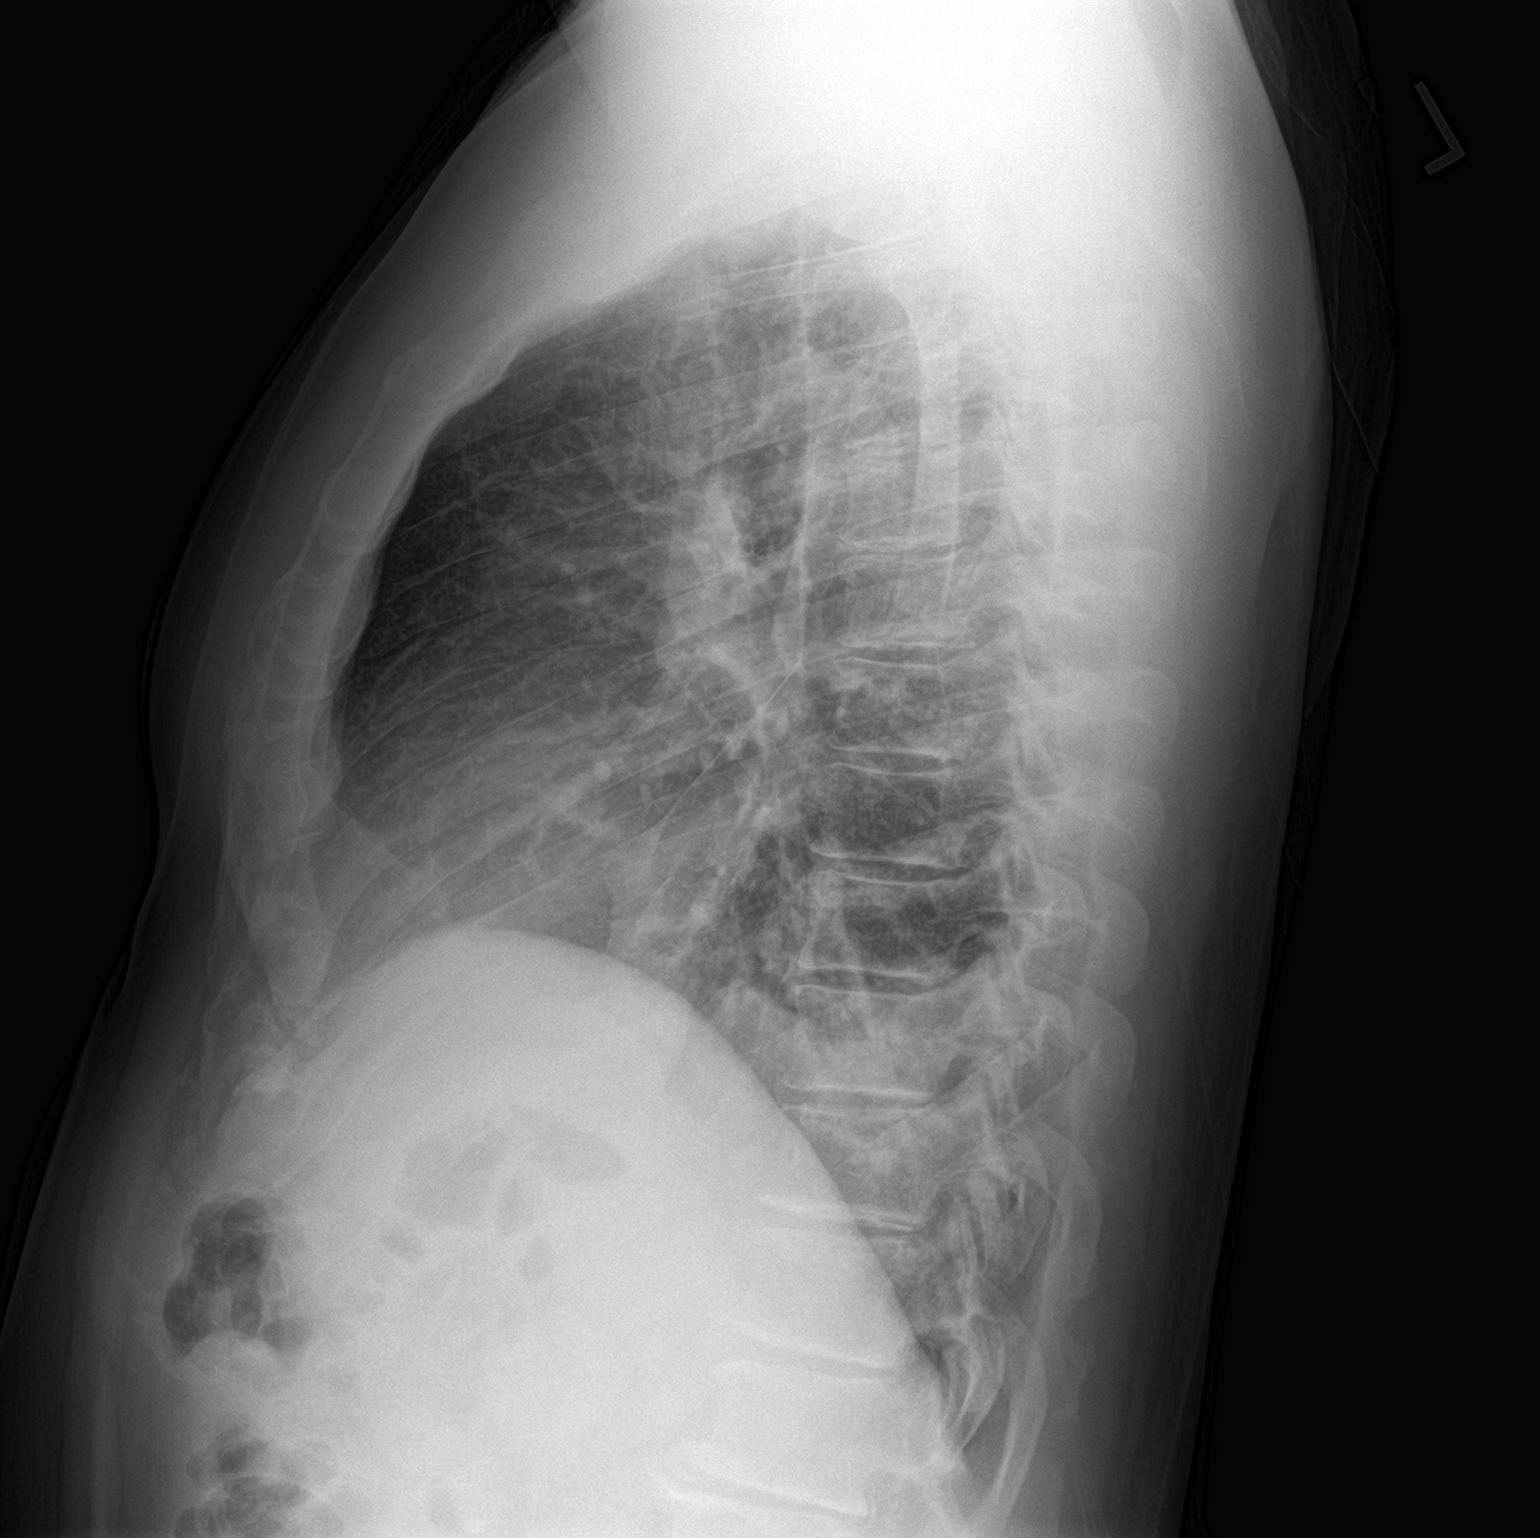

[2 of 2 positions shown; findings below may reference images not displayed]

FINDINGS: The heart size and mediastinal contours are within normal limits.
Previously seen airspace disease in the right upper lobe and left
lower lobe has nearly completely resolved. A small left pleural
effusion is seen which is new since previous study.
IMPRESSION: Near complete resolution of right upper lobe and left lower lobe
airspace disease.

New small left pleural effusion.

## 2022-10-25 ENCOUNTER — Encounter: Payer: Self-pay | Admitting: Gastroenterology

## 2022-10-25 ENCOUNTER — Ambulatory Visit (AMBULATORY_SURGERY_CENTER): Payer: BC Managed Care – PPO | Admitting: Gastroenterology

## 2022-10-25 VITALS — BP 112/76 | HR 52 | Temp 98.9°F | Resp 13 | Ht 71.5 in | Wt 215.0 lb

## 2022-10-25 DIAGNOSIS — Z1211 Encounter for screening for malignant neoplasm of colon: Secondary | ICD-10-CM | POA: Diagnosis not present

## 2022-10-25 DIAGNOSIS — K6289 Other specified diseases of anus and rectum: Secondary | ICD-10-CM | POA: Diagnosis not present

## 2022-10-25 DIAGNOSIS — K635 Polyp of colon: Secondary | ICD-10-CM | POA: Diagnosis not present

## 2022-10-25 DIAGNOSIS — D122 Benign neoplasm of ascending colon: Secondary | ICD-10-CM | POA: Diagnosis not present

## 2022-10-25 MED ORDER — SODIUM CHLORIDE 0.9 % IV SOLN: 500.0000 mL | Freq: Once | INTRAVENOUS | Status: DC

## 2022-10-25 NOTE — Progress Notes (Signed)
   Referring Provider: Omauri Bouche, NP Primary Care Physician:  Bijon Bouche, NP  Indication for Colonoscopy:  Colon cancer screening   IMPRESSION:  Need for colon cancer screening Appropriate candidate for monitored anesthesia care  PLAN: Colonoscopy in the [facility] today   HPI: Blake Kelly is a 52 y.o. male presents for screening colonoscopy.  No prior colonoscopy or colon cancer screening.  Father with colon polyps. No other known family history of colon cancer or polyps. No family history of uterine/endometrial cancer, pancreatic cancer or gastric/stomach cancer.   Past Medical History:  Diagnosis Date   Arthritis    "entire body"   Erectile dysfunction    GERD (gastroesophageal reflux disease)     Past Surgical History:  Procedure Laterality Date    pelvis fracture repair     PELVIC FRACTURE SURGERY      Current Outpatient Medications  Medication Sig Dispense Refill   albuterol (VENTOLIN HFA) 108 (90 Base) MCG/ACT inhaler Inhale 2 puffs into the lungs every 6 (six) hours as needed for wheezing or shortness of breath. 8 g 2   MAGNESIUM PO Take by mouth daily.     omeprazole (PRILOSEC) 40 MG capsule Take 1 capsule (40 mg total) by mouth daily. 90 capsule 3   clobetasol cream (TEMOVATE) [id number] % Apply 1 Application topically 2 (two) times daily. (Patient not taking: Reported on 09/27/2022) 60 g 0   meloxicam (MOBIC) 15 MG tablet Take 1 tablet (15 mg total) by mouth daily. 90 tablet 1   Current Facility-Administered Medications  Medication Dose Route Frequency Provider Last Rate Last Admin   0.9 %  sodium chloride infusion  500 mL Intravenous Once [place], MD        Allergies as of 10/25/2022   (No Known Allergies)    Family History  Problem Relation Age of Onset   Colon polyps Father    Diverticulitis Father    Diverticulitis Paternal Aunt    Colon cancer Neg Hx    Crohn's disease Neg Hx    Esophageal cancer Neg Hx    Rectal cancer Neg Hx    Stomach  cancer Neg Hx      Physical Exam: General:   Alert,  well-nourished, pleasant and cooperative in NAD Head:  Normocephalic and atraumatic. Eyes:  Sclera clear, no icterus.   Conjunctiva pink. Mouth:  No deformity or lesions.   Neck:  Supple; no masses or thyromegaly. Lungs:  Clear throughout to auscultation.   No wheezes. Heart:  Regular rate and rhythm; no murmurs. Abdomen:  Soft, non-tender, nondistended, normal bowel sounds, no rebound or guarding.  Msk:  Symmetrical. No boney deformities LAD: No inguinal or umbilical LAD Extremities:  No clubbing or edema. Neurologic:  Alert and  oriented x4;  grossly nonfocal Skin:  No obvious rash or bruise. Psych:  Alert and cooperative. Normal mood and affect.     Studies/Results: No results found.    Myles Tavella L. Tarri Glenn, MD, MPH 10/25/2022, 11:48 AM

## 2022-10-25 NOTE — Progress Notes (Signed)
Pt resting comfortably. VSS. Airway intact. SBAR complete to RN. All questions answered.   

## 2022-10-25 NOTE — Progress Notes (Signed)
Called to room to assist during endoscopic procedure.  Patient ID and intended procedure confirmed with present staff. Received instructions for my participation in the procedure from the performing physician.  

## 2022-10-25 NOTE — Progress Notes (Signed)
Pt's states no medical or surgical changes since previsit or office visit. 

## 2022-10-25 NOTE — Op Note (Signed)
[facility] Patient Name: Blake Kelly Procedure Date: 10/25/2022 11:42 AM MRN: [id number] Endoscopist: Thornton Park MD, MD, [id number] Age: 52 Referring MD:  Date of Birth: 11/18/1970 Gender: Male Account #: [id number] Procedure:                Colonoscopy Indications:              Screening for colorectal malignant neoplasm                           Father with ulcerative colitis and polyps Medicines:                Monitored Anesthesia Care Procedure:                Pre-Anesthesia Assessment:                           - Prior to the procedure, a History and Physical                            was performed, and patient medications and                            allergies were reviewed. The patient's tolerance of                            previous anesthesia was also reviewed. The risks                            and benefits of the procedure and the sedation                            options and risks were discussed with the patient.                            All questions were answered, and informed consent                            was obtained. Prior Anticoagulants: The patient has                            taken no anticoagulant or antiplatelet agents. ASA                            Grade Assessment: II - A patient with mild systemic                            disease. After reviewing the risks and benefits,                            the patient was deemed in satisfactory condition to                            undergo the procedure.  After obtaining informed consent, the colonoscope                            was passed under direct vision. Throughout the                            procedure, the patient's blood pressure, pulse, and                            oxygen saturations were monitored continuously. The                            CF [id number] was introduced through the anus                            and advanced to the  3 cm into the ileum. A second                            forward view of the right colon was performed. The                            colonoscopy was performed without difficulty. The                            patient tolerated the procedure well. The quality                            of the bowel preparation was good. The terminal                            ileum, ileocecal valve, appendiceal orifice, and                            rectum were photographed. Scope In: 11:58:07 AM Scope Out: 12:19:24 PM Scope Withdrawal Time: 0 hours 18 minutes 21 seconds  Total Procedure Duration: 0 hours 21 minutes 17 seconds  Findings:                 The perianal and digital rectal examinations were                            normal.                           A 3 mm polyp was found in the rectum. The polyp was                            sessile. The polyp was removed with a cold snare.                            Resection and retrieval were complete. To stop                            active bleeding after  the polypectomy, one                            hemostatic clip was successfully placed (MR                            conditional). There was no bleeding at the end of                            the procedure.                           An 8 mm polyp was found in the ascending colon. The                            polyp was flat. The polyp was removed with a cold                            snare in a piecemeal fashion. Resection and                            retrieval were complete. Estimated blood loss was                            minimal.                           The exam was otherwise without abnormality on                            direct and retroflexion views. Complications:            No immediate complications. Estimated Blood Loss:     Estimated blood loss was minimal. Impression:               - One 3 mm polyp in the rectum, removed with a cold                            snare.  Resected and retrieved. Clip (MR                            conditional) was placed.                           - One 8 mm polyp in the ascending colon, removed                            with a cold snare. Resected and retrieved.                           - The examination was otherwise normal on direct                            and retroflexion views. Recommendation:           - Patient  has a contact number available for                            emergencies. The signs and symptoms of potential                            delayed complications were discussed with the                            patient. Return to normal activities tomorrow.                            Written discharge instructions were provided to the                            patient.                           - Resume previous diet.                           - Continue present medications.                           - Await pathology results.                           - Repeat colonoscopy date to be determined after                            pending pathology results are reviewed for                            surveillance.                           - Emerging evidence supports eating a diet of                            fruits, vegetables, grains, calcium, and yogurt                            while reducing red meat and alcohol may reduce the                            risk of colon cancer.                           - Thank you for allowing me to be involved in your                            colon cancer prevention. Thornton Park MD, MD 10/25/2022 12:26:32 PM This report has been signed electronically.

## 2022-10-25 NOTE — Patient Instructions (Signed)
Handout on polyps given to patient. Await pathology results. Resume previous diet and continue present medications. Repeat colonoscopy for surveillance will be determined based off of pathology results.   YOU HAD AN ENDOSCOPIC PROCEDURE TODAY AT [facility]:   Refer to the procedure report that was given to you for any specific questions about what was found during the examination.  If the procedure report does not answer your questions, please call your gastroenterologist to clarify.  If you requested that your care partner not be given the details of your procedure findings, then the procedure report has been included in a sealed envelope for you to review at your convenience later.  YOU SHOULD EXPECT: Some feelings of bloating in the abdomen. Passage of more gas than usual.  Walking can help get rid of the air that was put into your GI tract during the procedure and reduce the bloating. If you had a lower endoscopy (such as a colonoscopy or flexible sigmoidoscopy) you may notice spotting of blood in your stool or on the toilet paper. If you underwent a bowel prep for your procedure, you may not have a normal bowel movement for a few days.  Please Note:  You might notice some irritation and congestion in your nose or some drainage.  This is from the oxygen used during your procedure.  There is no need for concern and it should clear up in a day or so.  SYMPTOMS TO REPORT IMMEDIATELY:  Following lower endoscopy (colonoscopy or flexible sigmoidoscopy):  Excessive amounts of blood in the stool  Significant tenderness or worsening of abdominal pains  Swelling of the abdomen that is new, acute  Fever of 100F or higher  For urgent or emergent issues, a gastroenterologist can be reached at any hour by calling [phone]. Do not use MyChart messaging for urgent concerns.    DIET:  We do recommend a small meal at first, but then you may proceed to your regular diet.  Drink  plenty of fluids but you should avoid alcoholic beverages for 24 hours.  ACTIVITY:  You should plan to take it easy for the rest of today and you should NOT DRIVE or use heavy machinery until tomorrow (because of the sedation medicines used during the test).    FOLLOW UP: Our staff will call the number listed on your records the next business day following your procedure.  We will call around 7:15- 8:00 am to check on you and address any questions or concerns that you may have regarding the information given to you following your procedure. If we do not reach you, we will leave a message.     If any biopsies were taken you will be contacted by phone or by letter within the next 1-3 weeks.  Please call us at [phone] if you have not heard about the biopsies in 3 weeks.    SIGNATURES/CONFIDENTIALITY: You and/or your care partner have signed paperwork which will be entered into your electronic medical record.  These signatures attest to the fact that that the information above on your After Visit Summary has been reviewed and is understood.  Full responsibility of the confidentiality of this discharge information lies with you and/or your care-partner.

## 2022-10-26 ENCOUNTER — Telehealth: Payer: Self-pay | Admitting: *Deleted

## 2022-10-26 NOTE — Telephone Encounter (Signed)
  Follow up Call-     10/25/2022   11:28 AM  Call back number  Post procedure Call Back phone  # [phone]  Permission to leave phone message Yes     Patient questions:  Do you have a fever, pain , or abdominal swelling? No. Pain Score  0 *  Have you tolerated food without any problems? Yes.    Have you been able to return to your normal activities? Yes.    Do you have any questions about your discharge instructions: Diet   No. Medications  No. Follow up visit  No.  Do you have questions or concerns about your Care? No.  Actions: * If pain score is 4 or above: No action needed, pain <4.

## 2022-10-27 DIAGNOSIS — M5431 Sciatica, right side: Secondary | ICD-10-CM | POA: Diagnosis not present

## 2022-10-27 DIAGNOSIS — M9905 Segmental and somatic dysfunction of pelvic region: Secondary | ICD-10-CM | POA: Diagnosis not present

## 2022-10-27 DIAGNOSIS — M9904 Segmental and somatic dysfunction of sacral region: Secondary | ICD-10-CM | POA: Diagnosis not present

## 2022-10-27 DIAGNOSIS — M9903 Segmental and somatic dysfunction of lumbar region: Secondary | ICD-10-CM | POA: Diagnosis not present

## 2022-10-28 DIAGNOSIS — M9904 Segmental and somatic dysfunction of sacral region: Secondary | ICD-10-CM | POA: Diagnosis not present

## 2022-10-28 DIAGNOSIS — M9905 Segmental and somatic dysfunction of pelvic region: Secondary | ICD-10-CM | POA: Diagnosis not present

## 2022-10-28 DIAGNOSIS — M5431 Sciatica, right side: Secondary | ICD-10-CM | POA: Diagnosis not present

## 2022-10-28 DIAGNOSIS — M9903 Segmental and somatic dysfunction of lumbar region: Secondary | ICD-10-CM | POA: Diagnosis not present

## 2022-11-11 ENCOUNTER — Encounter: Payer: Self-pay | Admitting: Gastroenterology

## 2022-11-15 DIAGNOSIS — M9904 Segmental and somatic dysfunction of sacral region: Secondary | ICD-10-CM | POA: Diagnosis not present

## 2022-11-15 DIAGNOSIS — M9905 Segmental and somatic dysfunction of pelvic region: Secondary | ICD-10-CM | POA: Diagnosis not present

## 2022-11-15 DIAGNOSIS — M5431 Sciatica, right side: Secondary | ICD-10-CM | POA: Diagnosis not present

## 2022-11-15 DIAGNOSIS — M9903 Segmental and somatic dysfunction of lumbar region: Secondary | ICD-10-CM | POA: Diagnosis not present

## 2022-12-13 DIAGNOSIS — M9905 Segmental and somatic dysfunction of pelvic region: Secondary | ICD-10-CM | POA: Diagnosis not present

## 2022-12-13 DIAGNOSIS — M5431 Sciatica, right side: Secondary | ICD-10-CM | POA: Diagnosis not present

## 2022-12-13 DIAGNOSIS — M9904 Segmental and somatic dysfunction of sacral region: Secondary | ICD-10-CM | POA: Diagnosis not present

## 2022-12-13 DIAGNOSIS — M9903 Segmental and somatic dysfunction of lumbar region: Secondary | ICD-10-CM | POA: Diagnosis not present

## 2023-01-24 DIAGNOSIS — M9905 Segmental and somatic dysfunction of pelvic region: Secondary | ICD-10-CM | POA: Diagnosis not present

## 2023-01-24 DIAGNOSIS — M5431 Sciatica, right side: Secondary | ICD-10-CM | POA: Diagnosis not present

## 2023-01-24 DIAGNOSIS — M9904 Segmental and somatic dysfunction of sacral region: Secondary | ICD-10-CM | POA: Diagnosis not present

## 2023-01-24 DIAGNOSIS — M9903 Segmental and somatic dysfunction of lumbar region: Secondary | ICD-10-CM | POA: Diagnosis not present

## 2023-01-27 DIAGNOSIS — M9905 Segmental and somatic dysfunction of pelvic region: Secondary | ICD-10-CM | POA: Diagnosis not present

## 2023-01-27 DIAGNOSIS — M5431 Sciatica, right side: Secondary | ICD-10-CM | POA: Diagnosis not present

## 2023-01-27 DIAGNOSIS — M9904 Segmental and somatic dysfunction of sacral region: Secondary | ICD-10-CM | POA: Diagnosis not present

## 2023-01-27 DIAGNOSIS — M9903 Segmental and somatic dysfunction of lumbar region: Secondary | ICD-10-CM | POA: Diagnosis not present

## 2023-02-17 DIAGNOSIS — M9904 Segmental and somatic dysfunction of sacral region: Secondary | ICD-10-CM | POA: Diagnosis not present

## 2023-02-17 DIAGNOSIS — M9905 Segmental and somatic dysfunction of pelvic region: Secondary | ICD-10-CM | POA: Diagnosis not present

## 2023-02-17 DIAGNOSIS — M5431 Sciatica, right side: Secondary | ICD-10-CM | POA: Diagnosis not present

## 2023-02-17 DIAGNOSIS — M9903 Segmental and somatic dysfunction of lumbar region: Secondary | ICD-10-CM | POA: Diagnosis not present

## 2023-02-28 ENCOUNTER — Other Ambulatory Visit: Payer: Self-pay | Admitting: Medical-Surgical

## 2023-03-10 DIAGNOSIS — M9905 Segmental and somatic dysfunction of pelvic region: Secondary | ICD-10-CM | POA: Diagnosis not present

## 2023-03-10 DIAGNOSIS — M5431 Sciatica, right side: Secondary | ICD-10-CM | POA: Diagnosis not present

## 2023-03-10 DIAGNOSIS — M9903 Segmental and somatic dysfunction of lumbar region: Secondary | ICD-10-CM | POA: Diagnosis not present

## 2023-03-10 DIAGNOSIS — M9904 Segmental and somatic dysfunction of sacral region: Secondary | ICD-10-CM | POA: Diagnosis not present

## 2023-05-19 DIAGNOSIS — M9905 Segmental and somatic dysfunction of pelvic region: Secondary | ICD-10-CM | POA: Diagnosis not present

## 2023-05-19 DIAGNOSIS — M9903 Segmental and somatic dysfunction of lumbar region: Secondary | ICD-10-CM | POA: Diagnosis not present

## 2023-05-19 DIAGNOSIS — M5431 Sciatica, right side: Secondary | ICD-10-CM | POA: Diagnosis not present

## 2023-05-19 DIAGNOSIS — M9904 Segmental and somatic dysfunction of sacral region: Secondary | ICD-10-CM | POA: Diagnosis not present

## 2023-05-28 ENCOUNTER — Other Ambulatory Visit: Payer: Self-pay | Admitting: Medical-Surgical

## 2023-06-16 DIAGNOSIS — M9903 Segmental and somatic dysfunction of lumbar region: Secondary | ICD-10-CM | POA: Diagnosis not present

## 2023-06-16 DIAGNOSIS — M9905 Segmental and somatic dysfunction of pelvic region: Secondary | ICD-10-CM | POA: Diagnosis not present

## 2023-06-16 DIAGNOSIS — M5431 Sciatica, right side: Secondary | ICD-10-CM | POA: Diagnosis not present

## 2023-06-16 DIAGNOSIS — M9904 Segmental and somatic dysfunction of sacral region: Secondary | ICD-10-CM | POA: Diagnosis not present

## 2023-06-30 DIAGNOSIS — M9904 Segmental and somatic dysfunction of sacral region: Secondary | ICD-10-CM | POA: Diagnosis not present

## 2023-06-30 DIAGNOSIS — M5431 Sciatica, right side: Secondary | ICD-10-CM | POA: Diagnosis not present

## 2023-06-30 DIAGNOSIS — M9905 Segmental and somatic dysfunction of pelvic region: Secondary | ICD-10-CM | POA: Diagnosis not present

## 2023-06-30 DIAGNOSIS — M9903 Segmental and somatic dysfunction of lumbar region: Secondary | ICD-10-CM | POA: Diagnosis not present

## 2023-07-20 DIAGNOSIS — M5431 Sciatica, right side: Secondary | ICD-10-CM | POA: Diagnosis not present

## 2023-07-20 DIAGNOSIS — M9904 Segmental and somatic dysfunction of sacral region: Secondary | ICD-10-CM | POA: Diagnosis not present

## 2023-07-20 DIAGNOSIS — M9905 Segmental and somatic dysfunction of pelvic region: Secondary | ICD-10-CM | POA: Diagnosis not present

## 2023-07-20 DIAGNOSIS — M9903 Segmental and somatic dysfunction of lumbar region: Secondary | ICD-10-CM | POA: Diagnosis not present

## 2023-08-11 DIAGNOSIS — M9904 Segmental and somatic dysfunction of sacral region: Secondary | ICD-10-CM | POA: Diagnosis not present

## 2023-08-11 DIAGNOSIS — M9903 Segmental and somatic dysfunction of lumbar region: Secondary | ICD-10-CM | POA: Diagnosis not present

## 2023-08-11 DIAGNOSIS — M9905 Segmental and somatic dysfunction of pelvic region: Secondary | ICD-10-CM | POA: Diagnosis not present

## 2023-08-11 DIAGNOSIS — M5431 Sciatica, right side: Secondary | ICD-10-CM | POA: Diagnosis not present

## 2023-08-25 DIAGNOSIS — M9905 Segmental and somatic dysfunction of pelvic region: Secondary | ICD-10-CM | POA: Diagnosis not present

## 2023-08-25 DIAGNOSIS — M5431 Sciatica, right side: Secondary | ICD-10-CM | POA: Diagnosis not present

## 2023-08-25 DIAGNOSIS — M9904 Segmental and somatic dysfunction of sacral region: Secondary | ICD-10-CM | POA: Diagnosis not present

## 2023-08-25 DIAGNOSIS — M9903 Segmental and somatic dysfunction of lumbar region: Secondary | ICD-10-CM | POA: Diagnosis not present

## 2023-08-30 ENCOUNTER — Other Ambulatory Visit: Payer: Self-pay | Admitting: Medical-Surgical

## 2023-09-05 ENCOUNTER — Other Ambulatory Visit: Payer: Self-pay | Admitting: Medical-Surgical

## 2023-09-15 DIAGNOSIS — M9905 Segmental and somatic dysfunction of pelvic region: Secondary | ICD-10-CM | POA: Diagnosis not present

## 2023-09-15 DIAGNOSIS — M9903 Segmental and somatic dysfunction of lumbar region: Secondary | ICD-10-CM | POA: Diagnosis not present

## 2023-09-15 DIAGNOSIS — M9904 Segmental and somatic dysfunction of sacral region: Secondary | ICD-10-CM | POA: Diagnosis not present

## 2023-09-15 DIAGNOSIS — M5431 Sciatica, right side: Secondary | ICD-10-CM | POA: Diagnosis not present

## 2023-09-25 ENCOUNTER — Other Ambulatory Visit: Payer: Self-pay | Admitting: Medical-Surgical

## 2023-09-30 ENCOUNTER — Other Ambulatory Visit: Payer: Self-pay | Admitting: Medical-Surgical

## 2023-10-06 DIAGNOSIS — M9905 Segmental and somatic dysfunction of pelvic region: Secondary | ICD-10-CM | POA: Diagnosis not present

## 2023-10-06 DIAGNOSIS — M9904 Segmental and somatic dysfunction of sacral region: Secondary | ICD-10-CM | POA: Diagnosis not present

## 2023-10-06 DIAGNOSIS — M9903 Segmental and somatic dysfunction of lumbar region: Secondary | ICD-10-CM | POA: Diagnosis not present

## 2023-10-06 DIAGNOSIS — M5431 Sciatica, right side: Secondary | ICD-10-CM | POA: Diagnosis not present

## 2023-10-24 ENCOUNTER — Ambulatory Visit (INDEPENDENT_AMBULATORY_CARE_PROVIDER_SITE_OTHER): Payer: BC Managed Care – PPO | Admitting: Medical-Surgical

## 2023-10-24 ENCOUNTER — Encounter: Payer: Self-pay | Admitting: Medical-Surgical

## 2023-10-24 VITALS — BP 135/72 | HR 55 | Resp 20 | Ht 71.5 in | Wt 234.1 lb

## 2023-10-24 DIAGNOSIS — E785 Hyperlipidemia, unspecified: Secondary | ICD-10-CM

## 2023-10-24 DIAGNOSIS — M67432 Ganglion, left wrist: Secondary | ICD-10-CM

## 2023-10-24 DIAGNOSIS — Z125 Encounter for screening for malignant neoplasm of prostate: Secondary | ICD-10-CM

## 2023-10-24 DIAGNOSIS — R7401 Elevation of levels of liver transaminase levels: Secondary | ICD-10-CM | POA: Diagnosis not present

## 2023-10-24 DIAGNOSIS — Z23 Encounter for immunization: Secondary | ICD-10-CM | POA: Insufficient documentation

## 2023-10-24 DIAGNOSIS — R7301 Impaired fasting glucose: Secondary | ICD-10-CM | POA: Diagnosis not present

## 2023-10-24 DIAGNOSIS — Z0000 Encounter for general adult medical examination without abnormal findings: Secondary | ICD-10-CM | POA: Diagnosis not present

## 2023-10-24 DIAGNOSIS — J301 Allergic rhinitis due to pollen: Secondary | ICD-10-CM

## 2023-10-24 DIAGNOSIS — M67431 Ganglion, right wrist: Secondary | ICD-10-CM

## 2023-10-24 MED ORDER — MELOXICAM 15 MG PO TABS: 15.0000 mg | ORAL_TABLET | Freq: Every day | ORAL | 3 refills | Status: AC

## 2023-10-24 MED ORDER — ALBUTEROL SULFATE HFA 108 (90 BASE) MCG/ACT IN AERS: 1.0000 | INHALATION_SPRAY | Freq: Four times a day (QID) | RESPIRATORY_TRACT | 11 refills | Status: AC | PRN

## 2023-10-24 NOTE — Patient Instructions (Signed)

## 2023-10-24 NOTE — Progress Notes (Unsigned)
 Annual Wellness Visit     Patient: Blake Kelly, Male    DOB: 1971-02-28, 53 y.o.   MRN: [id number]  Subjective  Chief Complaint  Patient presents with   Annual Exam    [name] is a 53 y.o. male who presents today for his Annual Wellness Visit. He reports consuming a general diet. The patient has a physically strenuous job, but has no regular exercise apart from work.  He generally feels well. He reports sleeping well. He does have additional problems to discuss today.   The 10-year ASCVD risk score (Arnett DK, et al., 2019) is: 4.7%   Values used to calculate the score:     Age: 38 years     Sex: Male     Is Non-Hispanic African American: No     Diabetic: No     Tobacco smoker: No     Systolic Blood Pressure: 135 mmHg     Is BP treated: No     HDL Cholesterol: 63 mg/dL     Total Cholesterol: 223 mg/dL   HPI  Vision:Within last year   Patient Active Problem List   Diagnosis Date Noted   Dyslipidemia 10/24/2023   Impaired fasting glucose 08/29/2021   Arthritis 08/04/2020   Community acquired pneumonia 08/04/2020   Transaminitis 11/09/2016   Elevated CK 11/09/2016   Bilateral hand pain 11/08/2016   Thickening, skin 11/08/2016   Overweight 03/07/2015   History of fractured pelvis 03/07/2015   Lumbago 03/07/2015   Erectile dysfunction 03/07/2015   Past Medical History:  Diagnosis Date   Arthritis    "entire body"   Erectile dysfunction    GERD (gastroesophageal reflux disease)    Past Surgical History:  Procedure Laterality Date    pelvis fracture repair     PELVIC FRACTURE SURGERY     Social History   Tobacco Use   Smoking status: Former   Smokeless tobacco: Never  Vaping Use   Vaping status: Never Used  Substance Use Topics   Alcohol use: No    Alcohol/week: 0.0 standard drinks of alcohol   Drug use: No   Social History   Socioeconomic History   Marital status: Married    Spouse name: Not on file   Number of children: Not on file    Years of education: Not on file   Highest education level: Not on file  Occupational History   Not on file  Tobacco Use   Smoking status: Former   Smokeless tobacco: Never  Vaping Use   Vaping status: Never Used  Substance and Sexual Activity   Alcohol use: No    Alcohol/week: 0.0 standard drinks of alcohol   Drug use: No   Sexual activity: Yes    Partners: Female    Birth control/protection: Surgical  Other Topics Concern   Not on file  Social History Narrative   Not on file   Social Drivers of Health   Financial Resource Strain: Not on file  Food Insecurity: Not on file  Transportation Needs: Not on file  Physical Activity: Not on file  Stress: Not on file  Social Connections: Not on file  Intimate Partner Violence: Not on file   Family Status  Relation Name Status   Father  (Not Specified)   Oceanographer  (Not Specified)   Neg Hx  (Not Specified)  No partnership data on file   Family History  Problem Relation Age of Onset   Colon polyps Father    Diverticulitis Father  Diverticulitis Paternal Aunt    Colon cancer Neg Hx    Crohn's disease Neg Hx    Esophageal cancer Neg Hx    Rectal cancer Neg Hx    Stomach cancer Neg Hx    No Known Allergies    Medications: Outpatient Medications Prior to Visit  Medication Sig   MAGNESIUM PO Take by mouth daily.   omeprazole (PRILOSEC) 40 MG capsule Take 1 capsule (40 mg total) by mouth daily.   [DISCONTINUED] albuterol (VENTOLIN HFA) 108 (90 Base) MCG/ACT inhaler INHALE 2 PUFFS BY MOUTH EVERY 6 HOURS AS NEEDED FOR WHEEZING AND FOR SHORTNESS OF BREATH   [DISCONTINUED] clobetasol cream (TEMOVATE) 0.05 % Apply 1 Application topically 2 (two) times daily. (Patient not taking: Reported on 10/24/2023)   [DISCONTINUED] meloxicam (MOBIC) 15 MG tablet TAKE 1 TABLET BY MOUTH ONCE DAILY . APPOINTMENT REQUIRED FOR FUTURE REFILLS   No facility-administered medications prior to visit.    No Known Allergies  Patient Care  Team: Christen Butter, NP as PCP - General (Nurse Practitioner)  Review of Systems  Constitutional: Negative.   HENT: Negative.    Eyes: Negative.   Respiratory: Negative.    Cardiovascular: Negative.   Gastrointestinal: Negative.   Genitourinary: Negative.   Musculoskeletal:  Negative for joint pain.  Skin: Negative.   Neurological: Negative.   Endo/Heme/Allergies: Negative.   Psychiatric/Behavioral: Negative.          Objective  BP 135/72   Pulse (!) 55   Resp 20   Ht 5' 11.5" (1.816 m)   Wt 234 lb 1.9 oz (106.2 kg)   SpO2 97%   BMI 32.20 kg/m  BP Readings from Last 3 Encounters:  10/24/23 135/72  10/25/22 112/76  08/27/22 126/71      Physical Exam Constitutional:      Appearance: He is normal weight.  HENT:     Head: Normocephalic.     Right Ear: Tympanic membrane, ear canal and external ear normal.     Left Ear: Tympanic membrane and ear canal normal.     Nose: Nose normal.     Mouth/Throat:     Mouth: Mucous membranes are moist.     Pharynx: Oropharynx is clear.  Eyes:     Extraocular Movements: Extraocular movements intact.     Conjunctiva/sclera: Conjunctivae normal.     Pupils: Pupils are equal, round, and reactive to light.  Cardiovascular:     Rate and Rhythm: Normal rate and regular rhythm.     Pulses: Normal pulses.     Heart sounds: Normal heart sounds.  Pulmonary:     Effort: Pulmonary effort is normal.     Breath sounds: Normal breath sounds.  Abdominal:     General: Abdomen is flat. Bowel sounds are normal.     Palpations: Abdomen is soft.  Musculoskeletal:        General: Normal range of motion.     Cervical back: Normal range of motion.     Comments: Patient has " cysts" to bilateral snuffbox of bilateral wrists with reduced range of motion.   "Cysts" are 1 cm firm bilat. Left has greater surface effect than right.  Patient also has decreased  flexion extension and lateral ROM in bilateral wrists  Skin:    General: Skin is warm and dry.      Capillary Refill: Capillary refill takes less than 2 seconds.     Comments: Peeling dead skin to Palmar surface of right hand.  Neurological:  General: No focal deficit present.     Mental Status: He is alert. Mental status is at baseline.  Psychiatric:        Mood and Affect: Mood normal.        Behavior: Behavior normal.        Thought Content: Thought content normal.        Judgment: Judgment normal.       Most recent fall risk assessment:    10/24/2023    1:07 PM  Fall Risk   Falls in the past year? 0  Number falls in past yr: 0  Injury with Fall? 0  Risk for fall due to : No Fall Risks  Follow up Falls evaluation completed    Most recent depression screenings:    10/24/2023    1:07 PM 08/28/2021    9:25 AM  PHQ 2/9 Scores  PHQ - 2 Score 0 0  PHQ- 9 Score 0 0    A score of 3 or more in women, and 4 or more in men indicates increased risk for alcohol abuse, EXCEPT if all of the points are from question 1   Vision/Hearing Screen: No results found.  Last CBC Lab Results  Component Value Date   WBC 4.9 08/27/2022   HGB 15.3 08/27/2022   HCT 44.6 08/27/2022   MCV 85.3 08/27/2022   MCH 29.3 08/27/2022   RDW 12.6 08/27/2022   PLT 233 08/27/2022   Last metabolic panel Lab Results  Component Value Date   GLUCOSE 90 08/27/2022   NA 141 08/27/2022   K 5.1 08/27/2022   CL 103 08/27/2022   CO2 30 08/27/2022   BUN 15 08/27/2022   CREATININE 0.98 08/27/2022   EGFR 93 08/27/2022   CALCIUM 9.9 08/27/2022   PROT 7.0 08/27/2022   ALBUMIN 4.0 12/06/2016   BILITOT 0.8 08/27/2022   ALKPHOS 66 12/06/2016   AST 20 08/27/2022   ALT 16 08/27/2022   ANIONGAP 10 07/26/2020   Last lipids Lab Results  Component Value Date   CHOL 223 (H) 08/27/2022   HDL 63 08/27/2022   LDLCALC 143 (H) 08/27/2022   LDLDIRECT 131 (H) 03/07/2015   TRIG 72 08/27/2022   CHOLHDL 3.5 08/27/2022   Last hemoglobin A1c Lab Results  Component Value Date   HGBA1C 5.8 (H) 08/27/2022    Last thyroid functions Lab Results  Component Value Date   TSH 1.63 11/08/2016   Last vitamin D Lab Results  Component Value Date   VD25OH 49 11/08/2016       Assessment & Plan   Annual wellness visit done today including the all of the following: Reviewed patient's Family Medical History Reviewed and updated list of patient's medical providers Assessment of cognitive impairment was done Assessed patient's functional ability Established a written schedule for health screening services Health Risk Assessent Completed and Reviewed  Exercise Activities and Dietary recommendations  Goals   None     Immunization History  Administered Date(s) Administered   Janssen (J&J) SARS-COV-2 Vaccination 11/22/2019   Tdap 03/07/2015   Zoster Recombinant(Shingrix) 10/24/2023    Health Maintenance  Topic Date Due   COVID-19 Vaccine (2 - 2024-25 season) 11/09/2023 (Originally 04/24/2023)   INFLUENZA VACCINE  11/21/2023 (Originally 03/24/2023)   Hepatitis C Screening  10/23/2024 (Originally 03/14/1988)   HIV Screening  10/23/2024 (Originally 03/14/1985)   Zoster Vaccines- Shingrix (2 of 2) 12/19/2023   DTaP/Tdap/Td (2 - Td or Tdap) 03/06/2025   Colonoscopy  10/25/2027   HPV VACCINES  Aged Out     Discussed health benefits of physical activity, and encouraged him to engage in regular exercise appropriate for his age and condition.    Problem List Items Addressed This Visit       Endocrine   Impaired fasting glucose   POCT HGB A1c last in 09/14/2023 was 5.8.anaged with diet and activity Relevant Orders   Hemoglobin A1c     Other   Transaminitis   Denies any S&S. Denies Fatigue, Nausea, Vomiting, Jaundice and Abdominal pain.  Will check liver enzymes today.   Relevant Orders   CMP14+EGFR   Dyslipidemia   Managed with diet and exercise.  Patient is not on statin and is not interested in starting a statin.  Check lipid panel today. relevant Orders   Lipid panel   CMP14+EGFR    Other Visit Diagnoses       Annual physical exam    -  Primary   Patient describes overall health as good today without noticeable changes since last annual exam.  Relevant Orders   Lipid panel   CBC with Differential/Platelet   CMP14+EGFR     Prostate cancer screening       Patient over 36 years of age at [facility] will screen PSA today relevant Orders   PSA Total (Reflex To Free)     Need for shingles vaccine       Patient has not had shingles vaccine but acknowledges he had chickenpox as a child.  Has had family members who have had shingles and expressed interest in receiving shingles vaccine today.  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            Denies any OTCs.  States that he has used albuterol inhaler 4-5 times in the last month but normally only           needs it once or twice a month always associated with environmental triggers of dust or livestock feed.            Refill albuterol inhaler.  Which he got from           Relevant Orders             - albuterol (VENTOLIN HFA) 108 (90 Base) MCG/ACT inhaler            Ganglion cyst of both wrists           Presents with chronic " cysts" to bilateral snuffbox of bilateral wrists with reduced range of motion.  Pt            states he has had them for years and generally dont bother him unless bumped hard or when extreme            and sudden ROM gives sharp pain. "Cysts" are 1x1cm firm bilat. Left has greater surface effect than            right.  Patient also has decreased flexion & extension and lateral ROM in bilateral wrists. No               interventions at this time.    Return in about 1 year (around 10/23/2024) for annual physical exam or sooner if  needed.     Loma Sousa Student AGNP

## 2023-10-25 ENCOUNTER — Encounter: Payer: Self-pay | Admitting: Medical-Surgical

## 2023-10-25 LAB — LIPID PANEL
Chol/HDL Ratio: 4.5 ratio (ref 0.0–5.0)
Cholesterol, Total: 240 mg/dL — ABNORMAL HIGH (ref 100–199)
HDL: 53 mg/dL (ref >39)
LDL Chol Calc (NIH): 162 mg/dL — ABNORMAL HIGH (ref 0–99)
Triglycerides: 138 mg/dL (ref 0–149)
VLDL Cholesterol Cal: 25 mg/dL (ref 5–40)

## 2023-10-25 LAB — CBC WITH DIFFERENTIAL/PLATELET
Basophils Absolute: 0.1 10*3/uL (ref 0.0–0.2)
Basos: 2 %
EOS (ABSOLUTE): 0.1 10*3/uL (ref 0.0–0.4)
Eos: 3 %
Hematocrit: 47.1 % (ref 37.5–51.0)
Hemoglobin: 15.8 g/dL (ref 13.0–17.7)
Immature Grans (Abs): 0 10*3/uL (ref 0.0–0.1)
Immature Granulocytes: 0 %
Lymphocytes Absolute: 1.2 10*3/uL (ref 0.7–3.1)
Lymphs: 27 %
MCH: 29 pg (ref 26.6–33.0)
MCHC: 33.5 g/dL (ref 31.5–35.7)
MCV: 86 fL (ref 79–97)
Monocytes Absolute: 0.4 10*3/uL (ref 0.1–0.9)
Monocytes: 8 %
Neutrophils Absolute: 2.8 10*3/uL (ref 1.4–7.0)
Neutrophils: 60 %
Platelets: 225 10*3/uL (ref 150–450)
RBC: 5.45 x10E6/uL (ref 4.14–5.80)
RDW: 12.6 % (ref 11.6–15.4)
WBC: 4.6 10*3/uL (ref 3.4–10.8)

## 2023-10-25 LAB — CMP14+EGFR
ALT: 15 IU/L (ref 0–44)
AST: 20 IU/L (ref 0–40)
Albumin: 4.3 g/dL (ref 3.8–4.9)
Alkaline Phosphatase: 67 IU/L (ref 44–121)
BUN/Creatinine Ratio: 13 (ref 9–20)
BUN: 12 mg/dL (ref 6–24)
Bilirubin Total: 0.4 mg/dL (ref 0.0–1.2)
CO2: 25 mmol/L (ref 20–29)
Calcium: 9.4 mg/dL (ref 8.7–10.2)
Chloride: 105 mmol/L (ref 96–106)
Creatinine, Ser: 0.95 mg/dL (ref 0.76–1.27)
Globulin, Total: 2 g/dL (ref 1.5–4.5)
Glucose: 102 mg/dL — ABNORMAL HIGH (ref 70–99)
Potassium: 4.5 mmol/L (ref 3.5–5.2)
Sodium: 140 mmol/L (ref 134–144)
Total Protein: 6.3 g/dL (ref 6.0–8.5)
eGFR: 96 mL/min/{1.73_m2} (ref >59)

## 2023-10-25 LAB — PSA TOTAL (REFLEX TO FREE): Prostate Specific Ag, Serum: 0.7 ng/mL (ref 0.0–4.0)

## 2023-10-25 LAB — HEMOGLOBIN A1C
Est. average glucose Bld gHb Est-mCnc: 120 mg/dL
Hgb A1c MFr Bld: 5.8 % — ABNORMAL HIGH (ref 4.8–5.6)

## 2023-10-25 NOTE — Progress Notes (Signed)
 Medical screening examination/treatment was performed by qualified clinical staff member and as supervising provider I was immediately available for consultation/collaboration. I have reviewed documentation and agree with assessment and plan.  Thayer Ohm, DNP, APRN, FNP-BC [facility]

## 2023-12-01 DIAGNOSIS — M62830 Muscle spasm of back: Secondary | ICD-10-CM | POA: Diagnosis not present

## 2023-12-01 DIAGNOSIS — M9904 Segmental and somatic dysfunction of sacral region: Secondary | ICD-10-CM | POA: Diagnosis not present

## 2023-12-01 DIAGNOSIS — M9903 Segmental and somatic dysfunction of lumbar region: Secondary | ICD-10-CM | POA: Diagnosis not present

## 2023-12-01 DIAGNOSIS — M9905 Segmental and somatic dysfunction of pelvic region: Secondary | ICD-10-CM | POA: Diagnosis not present

## 2023-12-19 DIAGNOSIS — M9904 Segmental and somatic dysfunction of sacral region: Secondary | ICD-10-CM | POA: Diagnosis not present

## 2023-12-19 DIAGNOSIS — M62830 Muscle spasm of back: Secondary | ICD-10-CM | POA: Diagnosis not present

## 2023-12-19 DIAGNOSIS — M9905 Segmental and somatic dysfunction of pelvic region: Secondary | ICD-10-CM | POA: Diagnosis not present

## 2023-12-19 DIAGNOSIS — M9903 Segmental and somatic dysfunction of lumbar region: Secondary | ICD-10-CM | POA: Diagnosis not present

## 2024-01-02 DIAGNOSIS — M62830 Muscle spasm of back: Secondary | ICD-10-CM | POA: Diagnosis not present

## 2024-01-02 DIAGNOSIS — M9904 Segmental and somatic dysfunction of sacral region: Secondary | ICD-10-CM | POA: Diagnosis not present

## 2024-01-02 DIAGNOSIS — M9905 Segmental and somatic dysfunction of pelvic region: Secondary | ICD-10-CM | POA: Diagnosis not present

## 2024-01-02 DIAGNOSIS — M9903 Segmental and somatic dysfunction of lumbar region: Secondary | ICD-10-CM | POA: Diagnosis not present

## 2024-02-06 DIAGNOSIS — M62830 Muscle spasm of back: Secondary | ICD-10-CM | POA: Diagnosis not present

## 2024-02-06 DIAGNOSIS — M9904 Segmental and somatic dysfunction of sacral region: Secondary | ICD-10-CM | POA: Diagnosis not present

## 2024-02-06 DIAGNOSIS — M9903 Segmental and somatic dysfunction of lumbar region: Secondary | ICD-10-CM | POA: Diagnosis not present

## 2024-02-06 DIAGNOSIS — M9905 Segmental and somatic dysfunction of pelvic region: Secondary | ICD-10-CM | POA: Diagnosis not present

## 2024-03-15 DIAGNOSIS — M9904 Segmental and somatic dysfunction of sacral region: Secondary | ICD-10-CM | POA: Diagnosis not present

## 2024-03-15 DIAGNOSIS — M62830 Muscle spasm of back: Secondary | ICD-10-CM | POA: Diagnosis not present

## 2024-03-15 DIAGNOSIS — M9905 Segmental and somatic dysfunction of pelvic region: Secondary | ICD-10-CM | POA: Diagnosis not present

## 2024-03-15 DIAGNOSIS — M9903 Segmental and somatic dysfunction of lumbar region: Secondary | ICD-10-CM | POA: Diagnosis not present

## 2024-04-16 DIAGNOSIS — M9903 Segmental and somatic dysfunction of lumbar region: Secondary | ICD-10-CM | POA: Diagnosis not present

## 2024-04-16 DIAGNOSIS — M9904 Segmental and somatic dysfunction of sacral region: Secondary | ICD-10-CM | POA: Diagnosis not present

## 2024-04-16 DIAGNOSIS — M62830 Muscle spasm of back: Secondary | ICD-10-CM | POA: Diagnosis not present

## 2024-04-16 DIAGNOSIS — M9905 Segmental and somatic dysfunction of pelvic region: Secondary | ICD-10-CM | POA: Diagnosis not present

## 2024-05-08 DIAGNOSIS — M9905 Segmental and somatic dysfunction of pelvic region: Secondary | ICD-10-CM | POA: Diagnosis not present

## 2024-05-08 DIAGNOSIS — M9903 Segmental and somatic dysfunction of lumbar region: Secondary | ICD-10-CM | POA: Diagnosis not present

## 2024-05-08 DIAGNOSIS — M62830 Muscle spasm of back: Secondary | ICD-10-CM | POA: Diagnosis not present

## 2024-05-08 DIAGNOSIS — M9904 Segmental and somatic dysfunction of sacral region: Secondary | ICD-10-CM | POA: Diagnosis not present

## 2024-06-06 DIAGNOSIS — M62830 Muscle spasm of back: Secondary | ICD-10-CM | POA: Diagnosis not present

## 2024-06-06 DIAGNOSIS — M9904 Segmental and somatic dysfunction of sacral region: Secondary | ICD-10-CM | POA: Diagnosis not present

## 2024-06-06 DIAGNOSIS — M9903 Segmental and somatic dysfunction of lumbar region: Secondary | ICD-10-CM | POA: Diagnosis not present

## 2024-06-06 DIAGNOSIS — M9905 Segmental and somatic dysfunction of pelvic region: Secondary | ICD-10-CM | POA: Diagnosis not present

## 2024-07-02 DIAGNOSIS — M9903 Segmental and somatic dysfunction of lumbar region: Secondary | ICD-10-CM | POA: Diagnosis not present

## 2024-07-02 DIAGNOSIS — M9904 Segmental and somatic dysfunction of sacral region: Secondary | ICD-10-CM | POA: Diagnosis not present

## 2024-07-02 DIAGNOSIS — M9905 Segmental and somatic dysfunction of pelvic region: Secondary | ICD-10-CM | POA: Diagnosis not present

## 2024-07-02 DIAGNOSIS — M62830 Muscle spasm of back: Secondary | ICD-10-CM | POA: Diagnosis not present

## 2024-08-09 DIAGNOSIS — M62830 Muscle spasm of back: Secondary | ICD-10-CM | POA: Diagnosis not present

## 2024-08-09 DIAGNOSIS — M9903 Segmental and somatic dysfunction of lumbar region: Secondary | ICD-10-CM | POA: Diagnosis not present

## 2024-08-09 DIAGNOSIS — M9904 Segmental and somatic dysfunction of sacral region: Secondary | ICD-10-CM | POA: Diagnosis not present

## 2024-08-09 DIAGNOSIS — M9905 Segmental and somatic dysfunction of pelvic region: Secondary | ICD-10-CM | POA: Diagnosis not present
